# Patient Record
Sex: Female | Born: 1965
Health system: Southern US, Community
[De-identification: ages and names within clinical notes are randomized; demographics above are authoritative.]

## PROBLEM LIST (undated history)

## (undated) DIAGNOSIS — Z86010 Personal history of colonic polyps: Secondary | ICD-10-CM

## (undated) HISTORY — PX: BREAST SURGERY: SHX581

## (undated) HISTORY — PX: EYE SURGERY: SHX253

## (undated) HISTORY — DX: Personal history of colonic polyps: Z86.010

---

## 2013-10-02 ENCOUNTER — Ambulatory Visit (INDEPENDENT_AMBULATORY_CARE_PROVIDER_SITE_OTHER): Payer: BC Managed Care – PPO | Admitting: Emergency Medicine

## 2013-10-02 VITALS — BP 120/68 | HR 80 | Temp 98.3°F | Resp 14 | Ht 66.0 in | Wt 155.0 lb

## 2013-10-02 DIAGNOSIS — Z23 Encounter for immunization: Secondary | ICD-10-CM

## 2013-10-02 NOTE — Progress Notes (Signed)
[facility] [address], [place] [address] [phone]  Date:  10/02/2013   Name:  Shannon Contreras   DOB:  Feb 20, 1966   MRN:  [id number]  PCP:  No PCP Per Patient    Chief Complaint: PPD Reading   History of Present Illness:  Shannon Contreras is a 48 y.o. very pleasant female patient who presents with the following:  Comes for PPD read.  Needs varicella immunization.    There are no active problems to display for this patient.   History reviewed. No pertinent past medical history.  Past Surgical History  Procedure Laterality Date  . Eye surgery    . Breast surgery      History  Substance Use Topics  . Smoking status: Never Smoker   . Smokeless tobacco: Not on file  . Alcohol Use: Yes    Family History  Problem Relation Age of Onset  . Hypertension Mother   . Cancer Father     No Known Allergies  Medication list has been reviewed and updated.  No current outpatient prescriptions on file prior to visit.   No current facility-administered medications on file prior to visit.    Review of Systems:  As per HPI, otherwise negative.    Physical Examination: Filed Vitals:   10/02/13 1447  BP: 120/68  Pulse: 80  Temp: 98.3 F (36.8 C)  Resp: 14   Filed Vitals:   10/02/13 1447  Height: 5\' 6"  (1.676 m)  Weight: 155 lb (70.308 kg)   Body mass index is 25.03 kg/(m^2). Ideal Body Weight: Weight in (lb) to have BMI = 25: 154.6   GEN: WDWN, NAD, Non-toxic, Alert & Oriented x 3 HEENT: Atraumatic, Normocephalic.  Ears and Nose: No external deformity. EXTR: No clubbing/cyanosis/edema NEURO: Normal gait.  PSYCH: Normally interactive. Conversant. Not depressed or anxious appearing.  Calm demeanor.    Assessment and Plan: Sent for immunization PPD 12 mm   Signed,  Ellison Carwin, MD

## 2013-10-06 ENCOUNTER — Ambulatory Visit (INDEPENDENT_AMBULATORY_CARE_PROVIDER_SITE_OTHER): Payer: BC Managed Care – PPO | Admitting: Emergency Medicine

## 2013-10-06 ENCOUNTER — Ambulatory Visit: Payer: BC Managed Care – PPO

## 2013-10-06 VITALS — BP 110/64 | HR 78 | Temp 98.9°F | Resp 16 | Ht 65.5 in | Wt 151.0 lb

## 2013-10-06 DIAGNOSIS — R7611 Nonspecific reaction to tuberculin skin test without active tuberculosis: Secondary | ICD-10-CM

## 2013-10-06 MED ORDER — RIFAPENTINE 150 MG PO TABS: 6.0000 | ORAL_TABLET | ORAL | Status: DC

## 2013-10-06 MED ORDER — ISONIAZID 100 MG PO TABS: 100.0000 mg | ORAL_TABLET | ORAL | Status: DC

## 2013-10-06 NOTE — Patient Instructions (Signed)
Isoniazid, INH tablets What is this medicine? ISONIAZID (eye soe NYE a zid) is used to prevent or to treat tuberculosis (TB). This medicine may be used for other purposes; ask your health care provider or pharmacist if you have questions. What should I tell my health care provider before I take this medicine? They need to know if you have any of these conditions: -diabetes -HIV positive -if you frequently drink alcohol-containing beverages -kidney disease -liver disease -malnutrition -tingling of the fingers or toes, or other nerve disorder -an unusual or allergic reaction to isoniazid, other medicines, foods, dyes or preservatives -pregnant or trying to get pregnant -breast-feeding How should I use this medicine? Take this medicine by mouth with a glass of water. Follow the directions on the prescription label. Take this medicine on an empty stomach, at least 30 minutes before or 2 hours after food. Do not take with food. Take your medicine at regular intervals. Do not take your medicine more often than directed. For your therapy to work as well as possible, take each dose exactly as prescribed. Do not skip doses or stop your medicine even if you feel better. Skipping doses may make the TB resistant to this medicine and other medicines. Do not stop taking except on your doctor's advice. Talk to your pediatrician regarding the use of this medicine in children. Special care may be needed. Overdosage: If you think you have taken too much of this medicine contact a poison control center or emergency room at once. NOTE: This medicine is only for you. Do not share this medicine with others. What if I miss a dose? If you miss a dose, take it as soon as you can. If it is almost time for your next dose, take only that dose. Do not take double or extra doses. What may interact with this medicine? Do not take this medicine with any of the following medications: -entacapone -green  tea -levodopa -MAOIs like Carbex, Eldepryl, Marplan, Nardil, and Parnate -procarbazine -ranolazine -tolcapone This medicine may also interact with the following medications: -acetaminophen -alcohol -antacids -medicines for diabetes -medicines for fungal infections like ketoconazole and itraconazole -medicines for seizures like carbamazepine, phenobarbital, phenytoin, valproic acid -theophylline -zalcitabine This list may not describe all possible interactions. Give your health care provider a list of all the medicines, herbs, non-prescription drugs, or dietary supplements you use. Also tell them if you smoke, drink alcohol, or use illegal drugs. Some items may interact with your medicine. What should I watch for while using this medicine? Visit your doctor or health care professional for regular check ups. You will need blood work done regularly. You may need to take vitamin supplements while on this medicine. Talk to your doctor about the foods you eat and the vitamins you take. Avoid antacids for 2 hours before and after taking a dose of this medicine. Alcohol may interfere with the effect of this medicine. Avoid alcoholic drinks. If you are diabetic check your blood sugar as directed. Also, you may get a false-positive result for sugar in your urine. Talk with your doctor. What side effects may I notice from receiving this medicine? Side effects that you should report to your doctor or health care professional as soon as possible: -allergic reactions like skin rash, itching or hives, swelling of the face, lips, or tongue -breathing problems -changes in vision or eye pain -dark urine -fever, sore throat -hallucination, loss of contact with reality -loss of appetite -memory problems -nausea, vomiting -pain, tingling, numbness in the  hands or feet -redness, blistering, peeling or loosening of the skin, including inside the mouth -seizures -stomach pain -unusually weak or  tired -yellowing of the eyes or skin Side effects that usually do not require medical attention (report to your doctor or health care professional if they continue or are bothersome): -breast enlargement or tenderness -diarrhea -headache -upset stomach -trouble sleeping This list may not describe all possible side effects. Call your doctor for medical advice about side effects. You may report side effects to FDA at 1-800-FDA-1088. Where should I keep my medicine? Keep out of the reach of children. Store at room temperature between 15 and 30 degrees C (59 and 86 degrees F). Protect from light. Keep container tightly closed. Throw away any unused medicine after the expiration date. NOTE: This sheet is a summary. It may not cover all possible information. If you have questions about this medicine, talk to your doctor, pharmacist, or health care provider.  2014, Elsevier/Gold Standard. (2007-09-30 15:56:13)

## 2013-10-06 NOTE — Progress Notes (Addendum)
[facility] [address], [place] [address] [phone]  Date:  10/06/2013   Name:  Shannon Contreras   DOB:  09-19-1965   MRN:  [id number]  PCP:  No PCP Per Patient    Chief Complaint: CXR   History of Present Illness:  Shannon Contreras is a 48 y.o. very pleasant female patient who presents with the following:  For CXR following positive PPD   There are no active problems to display for this patient.   History reviewed. No pertinent past medical history.  Past Surgical History  Procedure Laterality Date  . Eye surgery    . Breast surgery      History  Substance Use Topics  . Smoking status: Never Smoker   . Smokeless tobacco: Not on file  . Alcohol Use: Yes    Family History  Problem Relation Age of Onset  . Hypertension Mother   . Cancer Father     No Known Allergies  Medication list has been reviewed and updated.  Current Outpatient Prescriptions on File Prior to Visit  Medication Sig Dispense Refill  . pregabalin (LYRICA) 25 MG capsule Take 25 mg by mouth 2 (two) times daily.       No current facility-administered medications on file prior to visit.    Review of Systems:  As per HPI, otherwise negative.    Physical Examination: Filed Vitals:   10/06/13 1244  BP: 110/64  Pulse: 78  Temp: 98.9 F (37.2 C)  Resp: 16   Filed Vitals:   10/06/13 1244  Height: 5' 5.5" (1.664 m)  Weight: 151 lb (68.493 kg)   Body mass index is 24.74 kg/(m^2). Ideal Body Weight: Weight in (lb) to have BMI = 25: 152.2   GEN: WDWN, NAD, Non-toxic, Alert & Oriented x 3 HEENT: Atraumatic, Normocephalic.  Ears and Nose: No external deformity. EXTR: No clubbing/cyanosis/edema NEURO: Normal gait.  PSYCH: Normally interactive. Conversant. Not depressed or anxious appearing.  Calm demeanor.    Assessment and Plan: CXR  Positive PPD Inh rifapentine   Signed,  Ellison Carwin, MD   [facility] reading (PRIMARY)  by  Dr. Ouida Sills  negative.   A

## 2016-03-22 DIAGNOSIS — Z0000 Encounter for general adult medical examination without abnormal findings: Secondary | ICD-10-CM | POA: Diagnosis not present

## 2016-04-06 ENCOUNTER — Ambulatory Visit (INDEPENDENT_AMBULATORY_CARE_PROVIDER_SITE_OTHER): Payer: BLUE CROSS/BLUE SHIELD | Admitting: Family Medicine

## 2016-04-06 VITALS — BP 120/72 | HR 85 | Temp 99.1°F | Resp 18 | Ht 65.5 in | Wt 159.0 lb

## 2016-04-06 DIAGNOSIS — Z1211 Encounter for screening for malignant neoplasm of colon: Secondary | ICD-10-CM

## 2016-04-06 DIAGNOSIS — Z01419 Encounter for gynecological examination (general) (routine) without abnormal findings: Secondary | ICD-10-CM | POA: Diagnosis not present

## 2016-04-06 DIAGNOSIS — Z113 Encounter for screening for infections with a predominantly sexual mode of transmission: Secondary | ICD-10-CM | POA: Diagnosis not present

## 2016-04-06 LAB — POCT URINALYSIS DIP (MANUAL ENTRY)
BILIRUBIN UA: NEGATIVE
GLUCOSE UA: NEGATIVE
Ketones, POC UA: NEGATIVE
Nitrite, UA: NEGATIVE
Protein Ur, POC: NEGATIVE
Spec Grav, UA: 1.01
Urobilinogen, UA: 0.2
pH, UA: 7

## 2016-04-06 LAB — POCT WET + KOH PREP
TRICH BY WET PREP: ABSENT
YEAST BY WET PREP: ABSENT
Yeast by KOH: ABSENT

## 2016-04-06 LAB — POC MICROSCOPIC URINALYSIS (UMFC): Mucus: ABSENT

## 2016-04-06 LAB — HIV ANTIBODY (ROUTINE TESTING W REFLEX): HIV 1&2 Ab, 4th Generation: NONREACTIVE

## 2016-04-06 LAB — POCT URINE PREGNANCY: Preg Test, Ur: NEGATIVE

## 2016-04-06 NOTE — Progress Notes (Signed)
Patient ID: Shannon Contreras, female    DOB: 10-Jan-1966, 50 y.o.   MRN: [id number]  PCP: No PCP Per Patient  Chief Complaint  Patient presents with  . pap smear  . colon screening    Subjective:   HPI 50 year old female presents for routine gynecological exam. She was seen here at [facility] several years ago. She works at [employer] and is here today requesting colon cancer screening and routine gynecological exam. She works with [employer] health at work program and receives her routine labs and exam through employee health and reports no current active health problems. She is divorced and is sexually active with female partners. Requests routine STD screening today.  Interested in [place] instead of colonoscopy and reports no family history of any cancer.   Social History   Social History  . Marital status: Married    Spouse name: N/A  . Number of children: N/A  . Years of education: N/A   Occupational History  . Not on file.   Social History Main Topics  . Smoking status: Never Smoker  . Smokeless tobacco: Never Used  . Alcohol use Yes  . Drug use: No  . Sexual activity: Not on file   Other Topics Concern  . Not on file   Social History Narrative  . No narrative on file    Family History  Problem Relation Age of Onset  . Hypertension Mother   . Cancer Father    Review of Systems See HPI  There are no active problems to display for this patient.    Prior to Admission medications   Medication Sig Start Date End Date Taking? Authorizing Provider  isoniazid (NYDRAZID) 100 MG tablet Take 1 tablet (100 mg total) by mouth once a week. Patient not taking: Reported on 04/06/2016 10/06/13   Roselee Culver, MD  pregabalin (LYRICA) 25 MG capsule Take 25 mg by mouth 2 (two) times daily.    Historical Provider, MD  Rifapentine 150 MG TABS Take 6 tablets (900 mg total) by mouth once a week. Patient not taking: Reported on 04/06/2016 10/06/13   Roselee Culver, MD    No Known Allergies     Objective:  Physical Exam  Constitutional: She is oriented to person, place, and time. She appears well-developed and well-nourished.  HENT:  Head: Normocephalic and atraumatic.  Eyes: Conjunctivae and EOM are normal. Pupils are equal, round, and reactive to light.  Neck: Normal range of motion. Neck supple.  Cardiovascular: Normal rate.   Pulmonary/Chest: Effort normal.  Abdominal: Soft.  Genitourinary: Vagina normal and uterus normal. No vaginal discharge found.  Genitourinary Comments: Normal female external genitalia without lesion. No inguinal lymphadenopathy. Vaginal mucosa is pink and moist without lesions. Cervix is closed without discharge, not friable. Pap smear obtained. No cervical motion tenderness, adnexal fullness or tenderness.  Musculoskeletal: Normal range of motion.  Neurological: She is alert and oriented to person, place, and time.  Skin: Skin is warm and dry.  Psychiatric: She has a normal mood and affect. Her behavior is normal. Judgment and thought content normal.   Vitals:   04/06/16 0910  BP: 120/72  Pulse: 85  Resp: 18  Temp: 99.1 F (37.3 C)   Assessment & Plan:  1. Encounter for annual routine gynecological examination - Pap IG and HPV (high risk) DNA detection - POCT Microscopic Urinalysis ([facility]) - POCT urine pregnancy - POCT Wet + KOH Prep - POCT urinalysis dipstick  2. Screen for STD (sexually  transmitted disease) - GC/Chlamydia Probe Amp - RPR - HIV antibody  3. Special screening for malignant neoplasms, colon - Cologuard  Will follow-up with you regarding your lab results.  Carroll Sage. Kenton Kingfisher, MSN, FNP-C [facility]

## 2016-04-06 NOTE — Patient Instructions (Addendum)
You will be notified of your lab results and your Cologuard specimen kit will be mailed.   IF you received an x-ray today, you will receive an invoice from [facility]. Please contact [facility] at [phone] with questions or concerns regarding your invoice.   IF you received labwork today, you will receive an invoice from [employer]. Please contact [facility] at [phone] with questions or concerns regarding your invoice.   Our billing staff will not be able to assist you with questions regarding bills from these companies.  You will be contacted with the lab results as soon as they are available. The fastest way to get your results is to activate your My Chart account. Instructions are located on the last page of this paperwork. If you have not heard from [place] regarding the results in 2 weeks, please contact this office.     Exercising to Stay Healthy Exercising regularly is important. It has many health benefits, such as:  Improving your overall fitness, flexibility, and endurance.  Increasing your bone density.  Helping with weight control.  Decreasing your body fat.  Increasing your muscle strength.  Reducing stress and tension.  Improving your overall health. In order to become healthy and stay healthy, it is recommended that you do moderate-intensity and vigorous-intensity exercise. You can tell that you are exercising at a moderate intensity if you have a higher heart rate and faster breathing, but you are still able to hold a conversation. You can tell that you are exercising at a vigorous intensity if you are breathing much harder and faster and cannot hold a conversation while exercising. HOW OFTEN SHOULD I EXERCISE? Choose an activity that you enjoy and set realistic goals. Your health care provider can help you to make an activity plan that works for you. Exercise regularly as directed by your health care provider. This may  include:   Doing resistance training twice each week, such as:  Push-ups.  Sit-ups.  Lifting weights.  Using resistance bands.  Doing a given intensity of exercise for a given amount of time. Choose from these options:  150 minutes of moderate-intensity exercise every week.  75 minutes of vigorous-intensity exercise every week.  A mix of moderate-intensity and vigorous-intensity exercise every week. Children, pregnant women, people who are out of shape, people who are overweight, and older adults may need to consult a health care provider for individual recommendations. If you have any sort of medical condition, be sure to consult your health care provider before starting a new exercise program.  WHAT ARE SOME EXERCISE IDEAS? Some moderate-intensity exercise ideas include:   Walking at a rate of 1 mile in 15 minutes.  Biking.  Hiking.  Golfing.  Dancing. Some vigorous-intensity exercise ideas include:   Walking at a rate of at least 4.5 miles per hour.  Jogging or running at a rate of 5 miles per hour.  Biking at a rate of at least 10 miles per hour.  Lap swimming.  Roller-skating or in-line skating.  Cross-country skiing.  Vigorous competitive sports, such as football, basketball, and soccer.  Jumping rope.  Aerobic dancing. WHAT ARE SOME EVERYDAY ACTIVITIES THAT CAN HELP ME TO GET EXERCISE?  Yard work, such as:  Psychologist, educational.  Raking and bagging leaves.  Washing and waxing your car.  Pushing a stroller.  Shoveling snow.  Gardening.  Washing windows or floors. HOW CAN I BE MORE ACTIVE IN MY DAY-TO-DAY ACTIVITIES?  Use the stairs instead of  the elevator.  Take a walk during your lunch break.  If you drive, park your car farther away from work or school.  If you take public transportation, get off one stop early and walk the rest of the way.  Make all of your phone calls while standing up and walking around.  Get up, stretch, and  walk around every 30 minutes throughout the day. WHAT GUIDELINES SHOULD I FOLLOW WHILE EXERCISING?  Do not exercise so much that you hurt yourself, feel dizzy, or get very short of breath.  Consult your health care provider before starting a new exercise program.  Wear comfortable clothes and shoes with good support.  Drink plenty of water while you exercise to prevent dehydration or heat stroke. Body water is lost during exercise and must be replaced.  Work out until you breathe faster and your heart beats faster.   This information is not intended to replace advice given to you by your health care provider. Make sure you discuss any questions you have with your health care provider.   Document Released: 06/16/2010 Document Revised: 06/04/2014 Document Reviewed: 10/15/2013 Elsevier Interactive Patient Education [employer].

## 2016-04-07 LAB — GC/CHLAMYDIA PROBE AMP
CT Probe RNA: NOT DETECTED
GC Probe RNA: NOT DETECTED

## 2016-04-07 LAB — RPR

## 2016-04-09 ENCOUNTER — Encounter: Payer: Self-pay | Admitting: Family Medicine

## 2016-04-09 NOTE — Progress Notes (Signed)
April 09, 2016   [address] [place] [address]   Dear Ms. Allen-Campbell,  Below are the results from your recent visit were as expected.  Resulted Orders  GC/Chlamydia Probe Amp  Result Value Ref Range   CT Probe RNA NOT DETECTED      Comment:                        **Normal Reference Range: NOT DETECTED**   This test was performed using the APTIMA COMBO2 Assay ([employer]   The analytical performance characteristics of this assay, when used to test SurePath specimens have been determined by [facility]      GC Probe RNA NOT DETECTED      Comment:                        **Normal Reference Range: NOT DETECTED**   This test was performed using the APTIMA COMBO2 Assay ([employer]   The analytical performance characteristics of this assay, when used to test SurePath specimens have been determined by [facility]      Narrative   Performed at:  [facility], [id number]                [place], [place] [address]  RPR  Result Value Ref Range   RPR Ser Ql NON REAC NON REAC   Narrative   Performed at:  [facility] [place], [id number]                [place], [place] [address]  HIV antibody  Result Value Ref Range   HIV 1&2 Ab, 4th Generation NONREACTIVE NONREACTIVE     Comment:       HIV-1 antigen and HIV-1/HIV-2 antibodies were not detected.  There is no laboratory evidence of HIV infection.   HIV-1/2 Antibody Diff        Not indicated. HIV-1 RNA, Qual TMA          Not indicated.     PLEASE NOTE: This information has been disclosed to you from records whose confidentiality may be protected by state law. If your state requires such protection, then the state law prohibits you from making any further disclosure of the information without the specific written consent of the person to whom it pertains, or as otherwise permitted by  law. A general authorization for the release of medical or other information is NOT sufficient for this purpose.   The performance of this assay has not been clinically validated in patients less than 20 years old.   For additional information please refer to http://education.[facility].com/faq/FAQ106.  (This link is being provided for informational/educational purposes only.)      Narrative   Performed at:  [facility], [id number]                [place], [place] [address]     If you have any questions or concerns, please don't hesitate to call.  Sincerely,   Carroll Sage. Kenton Kingfisher, MSN, FNP-C [facility]

## 2016-04-11 LAB — PAP IG AND HPV HIGH-RISK: HPV DNA HIGH RISK: NOT DETECTED

## 2016-08-17 ENCOUNTER — Ambulatory Visit (INDEPENDENT_AMBULATORY_CARE_PROVIDER_SITE_OTHER): Payer: BLUE CROSS/BLUE SHIELD | Admitting: Family Medicine

## 2016-08-17 ENCOUNTER — Encounter: Payer: Self-pay | Admitting: Family Medicine

## 2016-08-17 VITALS — BP 119/78 | HR 71 | Temp 98.0°F | Resp 16 | Ht 66.0 in | Wt 162.5 lb

## 2016-08-17 DIAGNOSIS — Z0000 Encounter for general adult medical examination without abnormal findings: Secondary | ICD-10-CM | POA: Diagnosis not present

## 2016-08-17 DIAGNOSIS — Z1231 Encounter for screening mammogram for malignant neoplasm of breast: Secondary | ICD-10-CM

## 2016-08-17 DIAGNOSIS — Z23 Encounter for immunization: Secondary | ICD-10-CM

## 2016-08-17 DIAGNOSIS — Z1211 Encounter for screening for malignant neoplasm of colon: Secondary | ICD-10-CM | POA: Diagnosis not present

## 2016-08-17 LAB — CBC WITH DIFFERENTIAL/PLATELET
BASOS ABS: 0 {cells}/uL (ref 0–200)
Basophils Relative: 0 %
EOS PCT: 3 %
Eosinophils Absolute: 219 cells/uL (ref 15–500)
HCT: 37.3 % (ref 35.0–45.0)
Hemoglobin: 12 g/dL (ref 11.7–15.5)
Lymphocytes Relative: 27 %
Lymphs Abs: 1971 cells/uL (ref 850–3900)
MCH: 28 pg (ref 27.0–33.0)
MCHC: 32.2 g/dL (ref 32.0–36.0)
MCV: 87.1 fL (ref 80.0–100.0)
MONOS PCT: 9 %
MPV: 10.5 fL (ref 7.5–12.5)
Monocytes Absolute: 657 cells/uL (ref 200–950)
NEUTROS ABS: 4453 {cells}/uL (ref 1500–7800)
NEUTROS PCT: 61 %
PLATELETS: 283 10*3/uL (ref 140–400)
RBC: 4.28 MIL/uL (ref 3.80–5.10)
RDW: 13.8 % (ref 11.0–15.0)
WBC: 7.3 10*3/uL (ref 3.8–10.8)

## 2016-08-17 LAB — HEPATIC FUNCTION PANEL
ALBUMIN: 4.1 g/dL (ref 3.6–5.1)
ALT: 14 U/L (ref 6–29)
AST: 19 U/L (ref 10–35)
Alkaline Phosphatase: 41 U/L (ref 33–130)
Bilirubin, Direct: 0.1 mg/dL (ref <=0.2)
Indirect Bilirubin: 0.3 mg/dL (ref 0.2–1.2)
Total Bilirubin: 0.4 mg/dL (ref 0.2–1.2)
Total Protein: 6.7 g/dL (ref 6.1–8.1)

## 2016-08-17 LAB — LIPID PANEL
CHOL/HDL RATIO: 2.4 ratio (ref <5.0)
CHOLESTEROL: 202 mg/dL — AB (ref <200)
HDL: 85 mg/dL (ref >50)
LDL Cholesterol: 104 mg/dL — ABNORMAL HIGH (ref <100)
TRIGLYCERIDES: 65 mg/dL (ref <150)
VLDL: 13 mg/dL (ref <30)

## 2016-08-17 LAB — BASIC METABOLIC PANEL
BUN: 10 mg/dL (ref 7–25)
CALCIUM: 9.1 mg/dL (ref 8.6–10.4)
CHLORIDE: 105 mmol/L (ref 98–110)
CO2: 27 mmol/L (ref 20–31)
CREATININE: 0.89 mg/dL (ref 0.50–1.05)
Glucose, Bld: 113 mg/dL — ABNORMAL HIGH (ref 65–99)
Potassium: 4 mmol/L (ref 3.5–5.3)
Sodium: 140 mmol/L (ref 135–146)

## 2016-08-17 LAB — TSH: TSH: 0.67 mIU/L

## 2016-08-17 NOTE — Progress Notes (Signed)
Pre visit review using our clinic review tool, if applicable. No additional management support is needed unless otherwise documented below in the visit note. 

## 2016-08-17 NOTE — Assessment & Plan Note (Signed)
Pt's PE WNL.  Tdap given today.  Referral to GI for colonoscopy placed and order entered for mammo.  Check labs.  Anticipatory guidance provided.

## 2016-08-17 NOTE — Addendum Note (Signed)
Addended by: Davis Gourd on: 08/17/2016 03:21 PM   Modules accepted: Orders

## 2016-08-17 NOTE — Patient Instructions (Signed)
Follow up in 1 year or as needed We'll notify you of your lab results and make any changes if needed Continue to work on healthy diet and regular exercise- you can do it! We'll call you with your GI appt for the colonoscopy consultation and the mammogram appt Call with any questions or concerns Welcome!  We're glad to have you!!!

## 2016-08-17 NOTE — Progress Notes (Signed)
   Subjective:    Patient ID: Shannon Contreras, female    DOB: [birth year], 51 y.o.   MRN: [id number]  HPI New to establish.  Previous MD- no regular provider since leaving [place] 3 yrs ago  CPE- no concerns today.  Due for colonoscopy, mammo.  UTD pap.  exercising   Review of Systems Patient reports no vision/ hearing changes, adenopathy,fever, weight change,  persistant/recurrent hoarseness , swallowing issues, chest pain, palpitations, edema, persistant/recurrent cough, hemoptysis, dyspnea (rest/exertional/paroxysmal nocturnal), gastrointestinal bleeding (melena, rectal bleeding), abdominal pain, significant heartburn, bowel changes, GU symptoms (dysuria, hematuria, incontinence), Gyn symptoms (abnormal  bleeding, pain),  syncope, focal weakness, memory loss, numbness & tingling, skin/hair/nail changes, abnormal bruising or bleeding, anxiety, or depression.     Objective:   Physical Exam General Appearance:    Alert, cooperative, no distress, appears stated age  Head:    Normocephalic, without obvious abnormality, atraumatic  Eyes:    PERRL, conjunctiva/corneas clear, EOM's intact, fundi    benign, both eyes  Ears:    Normal TM's and external ear canals, both ears  Nose:   Nares normal, septum midline, mucosa normal, no drainage    or sinus tenderness  Throat:   Lips, mucosa, and tongue normal; teeth and gums normal  Neck:   Supple, symmetrical, trachea midline, no adenopathy;    Thyroid: no enlargement/tenderness/nodules  Back:     Symmetric, no curvature, ROM normal, no CVA tenderness  Lungs:     Clear to auscultation bilaterally, respirations unlabored  Chest Wall:    No tenderness or deformity   Heart:    Regular rate and rhythm, S1 and S2 normal, no murmur, rub   or gallop  Breast Exam:    Deferred to GYN  Abdomen:     Soft, non-tender, bowel sounds active all four quadrants,    no masses, no organomegaly  Genitalia:    Deferred to GYN  Rectal:    Extremities:    Extremities normal, atraumatic, no cyanosis or edema  Pulses:   2+ and symmetric all extremities  Skin:   Skin color, texture, turgor normal, no rashes or lesions  Lymph nodes:   Cervical, supraclavicular, and axillary nodes normal  Neurologic:   CNII-XII intact, normal strength, sensation and reflexes    throughout          Assessment & Plan:

## 2016-08-18 LAB — VITAMIN D 25 HYDROXY (VIT D DEFICIENCY, FRACTURES): VIT D 25 HYDROXY: 28 ng/mL — AB (ref 30–100)

## 2016-08-20 ENCOUNTER — Encounter: Payer: Self-pay | Admitting: General Practice

## 2016-08-21 NOTE — Progress Notes (Signed)
Order(s) created erroneously. Erroneous order ID: [id number]  Order moved by: Darden Palmer  Order move date/time: 08/21/2016 3:14 PM  Source Patient: [id number]  Source Contact: 08/17/2016  Destination Patient: [id number]  Destination Contact: 08/12/2012

## 2016-08-22 ENCOUNTER — Encounter: Payer: Self-pay | Admitting: Internal Medicine

## 2016-09-18 ENCOUNTER — Ambulatory Visit
Admission: RE | Admit: 2016-09-18 | Discharge: 2016-09-18 | Disposition: A | Discharge status: Home / Self Care | Payer: BLUE CROSS/BLUE SHIELD | Source: Ambulatory Visit | Attending: Family Medicine | Admitting: Family Medicine

## 2016-09-18 DIAGNOSIS — Z1231 Encounter for screening mammogram for malignant neoplasm of breast: Secondary | ICD-10-CM | POA: Diagnosis not present

## 2016-10-05 ENCOUNTER — Encounter: Payer: Self-pay | Admitting: Internal Medicine

## 2016-10-05 ENCOUNTER — Ambulatory Visit (AMBULATORY_SURGERY_CENTER): Payer: Self-pay

## 2016-10-05 VITALS — Ht 67.0 in | Wt 162.2 lb

## 2016-10-05 DIAGNOSIS — Z1211 Encounter for screening for malignant neoplasm of colon: Secondary | ICD-10-CM

## 2016-10-05 NOTE — Progress Notes (Signed)
Denies allergies to eggs or soy products. Denies complication of anesthesia or sedation. Denies use of weight loss medication. Denies use of O2.   Emmi instructions given for colonoscopy.  

## 2016-10-12 ENCOUNTER — Encounter: Payer: Self-pay | Admitting: Internal Medicine

## 2016-10-12 ENCOUNTER — Ambulatory Visit (AMBULATORY_SURGERY_CENTER): Payer: BLUE CROSS/BLUE SHIELD | Admitting: Internal Medicine

## 2016-10-12 VITALS — BP 103/65 | HR 70 | Temp 98.7°F | Resp 13 | Ht 67.0 in | Wt 162.0 lb

## 2016-10-12 DIAGNOSIS — Z1211 Encounter for screening for malignant neoplasm of colon: Secondary | ICD-10-CM

## 2016-10-12 DIAGNOSIS — Z1212 Encounter for screening for malignant neoplasm of rectum: Secondary | ICD-10-CM | POA: Diagnosis not present

## 2016-10-12 DIAGNOSIS — D122 Benign neoplasm of ascending colon: Secondary | ICD-10-CM

## 2016-10-12 MED ORDER — SODIUM CHLORIDE 0.9 % IV SOLN: 500.0000 mL | INTRAVENOUS | Status: DC

## 2016-10-12 NOTE — Progress Notes (Signed)
Report to PACU, RN, vss, BBS= Clear.  

## 2016-10-12 NOTE — Patient Instructions (Addendum)
I found and removed one tiny polyp. It looks benign. I will let you know pathology results and when to have another routine colonoscopy by mail and/or My Chart.  I appreciate the opportunity to care for you. Gatha Mayer, MD, [facility]    Information on hemorrhoid banding given to you today  Information on polyps given to you today  Await pathology results in a letter from Dr Carlean Purl  Dr Celesta Aver office staff member will call you with banding of hemorrhoids appointments   YOU HAD AN ENDOSCOPIC PROCEDURE TODAY AT [facility]:   Refer to the procedure report that was given to you for any specific questions about what was found during the examination.  If the procedure report does not answer your questions, please call your gastroenterologist to clarify.  If you requested that your care partner not be given the details of your procedure findings, then the procedure report has been included in a sealed envelope for you to review at your convenience later.  YOU SHOULD EXPECT: Some feelings of bloating in the abdomen. Passage of more gas than usual.  Walking can help get rid of the air that was put into your GI tract during the procedure and reduce the bloating. If you had a lower endoscopy (such as a colonoscopy or flexible sigmoidoscopy) you may notice spotting of blood in your stool or on the toilet paper. If you underwent a bowel prep for your procedure, you may not have a normal bowel movement for a few days.  Please Note:  You might notice some irritation and congestion in your nose or some drainage.  This is from the oxygen used during your procedure.  There is no need for concern and it should clear up in a day or so.  SYMPTOMS TO REPORT IMMEDIATELY:   Following lower endoscopy (colonoscopy or flexible sigmoidoscopy):  Excessive amounts of blood in the stool  Significant tenderness or worsening of abdominal pains  Swelling of the abdomen that is new,  acute  Fever of 100F or higher    For urgent or emergent issues, a gastroenterologist can be reached at any hour by calling [phone].   DIET:  We do recommend a small meal at first, but then you may proceed to your regular diet.  Drink plenty of fluids but you should avoid alcoholic beverages for 24 hours.  ACTIVITY:  You should plan to take it easy for the rest of today and you should NOT DRIVE or use heavy machinery until tomorrow (because of the sedation medicines used during the test).    FOLLOW UP: Our staff will call the number listed on your records the next business day following your procedure to check on you and address any questions or concerns that you may have regarding the information given to you following your procedure. If we do not reach you, we will leave a message.  However, if you are feeling well and you are not experiencing any problems, there is no need to return our call.  We will assume that you have returned to your regular daily activities without incident.  If any biopsies were taken you will be contacted by phone or by letter within the next 1-3 weeks.  Please call us at [phone] if you have not heard about the biopsies in 3 weeks.    SIGNATURES/CONFIDENTIALITY: You and/or your care partner have signed paperwork which will be entered into your electronic medical record.  These signatures attest to  the fact that that the information above on your After Visit Summary has been reviewed and is understood.  Full responsibility of the confidentiality of this discharge information lies with you and/or your care-partner.

## 2016-10-12 NOTE — Progress Notes (Signed)
Pt's states no medical or surgical changes since previsit or office visit. 

## 2016-10-12 NOTE — Op Note (Addendum)
[place] Patient Name: Shannon Contreras Procedure Date: 10/12/2016 1:37 PM MRN: [id number] Endoscopist: Gatha Mayer , MD Age: 51 Referring MD:  Date of Birth: 10-Nov-1965 Gender: Female Account #: [id number] Procedure:                Colonoscopy Indications:              Screening for colorectal malignant neoplasm, This                            is the patient's first colonoscopy Medicines:                Propofol per Anesthesia, Monitored Anesthesia Care Procedure:                Pre-Anesthesia Assessment:                           - Prior to the procedure, a History and Physical                            was performed, and patient medications and                            allergies were reviewed. The patient's tolerance of                            previous anesthesia was also reviewed. The risks                            and benefits of the procedure and the sedation                            options and risks were discussed with the patient.                            All questions were answered, and informed consent                            was obtained. Prior Anticoagulants: The patient has                            taken no previous anticoagulant or antiplatelet                            agents. ASA Grade Assessment: I - A normal, healthy                            patient. After reviewing the risks and benefits,                            the patient was deemed in satisfactory condition to                            undergo the procedure.  After obtaining informed consent, the colonoscope                            was passed under direct vision. Throughout the                            procedure, the patient's blood pressure, pulse, and                            oxygen saturations were monitored continuously. The                            Colonoscope was introduced through the anus and   advanced to the the cecum, identified by                            appendiceal orifice and ileocecal valve. The                            colonoscopy was performed without difficulty. The                            patient tolerated the procedure well. The quality                            of the bowel preparation was excellent. The bowel                            preparation used was Miralax. The ileocecal valve,                            appendiceal orifice, and rectum were photographed. Scope In: 1:42:54 PM Scope Out: 1:55:45 PM Scope Withdrawal Time: 0 hours 9 minutes 2 seconds  Total Procedure Duration: 0 hours 12 minutes 51 seconds  Findings:                 The perianal and digital rectal examinations were                            normal.                           A 3 mm polyp was found in the ascending colon. The                            polyp was sessile. The polyp was removed with a                            cold snare. Resection and retrieval were complete.                            Verification of patient identification for the                            specimen  was done. Estimated blood loss was minimal.                           Internal hemorrhoids were found during retroflexion.                           The exam was otherwise without abnormality on                            direct and retroflexion views. Complications:            No immediate complications. Estimated Blood Loss:     Estimated blood loss was minimal. Impression:               - One 3 mm polyp in the ascending colon, removed                            with a cold snare. Resected and retrieved.                           - The examination was otherwise normal on direct                            and retroflexion views. Recommendation:           - Patient has a contact number available for                            emergencies. The signs and symptoms of potential                             delayed complications were discussed with the                            patient. Return to normal activities tomorrow.                            Written discharge instructions were provided to the                            patient.                           - Resume previous diet.                           - Continue present medications.                           - Repeat colonoscopy is recommended. The                            colonoscopy date will be determined after pathology                            results from today's exam become available for  review.                           - We will contact her from office for hemorrhoid                            banding appointment - she has sxs of bleeding and                            requests treatment Gatha Mayer, MD 10/12/2016 2:00:38 PM This report has been signed electronically.

## 2016-10-12 NOTE — Progress Notes (Signed)
Called to room to assist during endoscopic procedure.  Patient ID and intended procedure confirmed with present staff. Received instructions for my participation in the procedure from the performing physician.  

## 2016-10-15 ENCOUNTER — Telehealth: Payer: Self-pay | Admitting: *Deleted

## 2016-10-15 NOTE — Telephone Encounter (Signed)
  Follow up Call-  Call back number 10/12/2016  Post procedure Call Back phone  # [phone]  Permission to leave phone message Yes  Some recent data might be hidden     Patient questions:  Do you have a fever, pain , or abdominal swelling? No. Pain Score  0 *  Have you tolerated food without any problems? Yes.    Have you been able to return to your normal activities? Yes.    Do you have any questions about your discharge instructions: Diet   No. Medications  No. Follow up visit  No.  Do you have questions or concerns about your Care? No.  Actions: * If pain score is 4 or above: No action needed, pain <4.

## 2016-10-16 ENCOUNTER — Telehealth: Payer: Self-pay

## 2016-10-16 NOTE — Telephone Encounter (Signed)
Spoke with Thayer Headings and she said she's used up her time for this month and she will call us back next month to set up a future appointment for banding.

## 2016-10-16 NOTE — Telephone Encounter (Signed)
-----   Message from Gatha Mayer, MD sent at 10/12/2016  2:06 PM EDT ----- Regarding: Guess what? Needs non-urgent banding appt

## 2016-10-19 ENCOUNTER — Encounter: Payer: Self-pay | Admitting: Internal Medicine

## 2016-10-19 ENCOUNTER — Telehealth: Payer: Self-pay | Admitting: Family Medicine

## 2016-10-19 DIAGNOSIS — Z86010 Personal history of colonic polyps: Secondary | ICD-10-CM

## 2016-10-19 DIAGNOSIS — Z860101 Personal history of adenomatous and serrated colon polyps: Secondary | ICD-10-CM | POA: Insufficient documentation

## 2016-10-19 HISTORY — DX: Personal history of adenomatous and serrated colon polyps: Z86.0101

## 2016-10-19 HISTORY — DX: Personal history of colonic polyps: Z86.010

## 2016-10-19 NOTE — Telephone Encounter (Signed)
Chart updated to reflect change.

## 2016-10-19 NOTE — Telephone Encounter (Signed)
Pt calling asking if her chart could be updated, pt states that she gave wrong information regarding family history. Pt states that her grandfather on mothers side had prostate cancer.

## 2017-03-22 DIAGNOSIS — Z0000 Encounter for general adult medical examination without abnormal findings: Secondary | ICD-10-CM | POA: Diagnosis not present

## 2018-02-11 ENCOUNTER — Ambulatory Visit: Payer: BLUE CROSS/BLUE SHIELD | Admitting: Family Medicine

## 2018-02-12 ENCOUNTER — Encounter: Payer: Self-pay | Admitting: Family Medicine

## 2018-02-12 ENCOUNTER — Ambulatory Visit: Payer: BLUE CROSS/BLUE SHIELD | Admitting: Family Medicine

## 2018-02-12 ENCOUNTER — Other Ambulatory Visit: Payer: Self-pay

## 2018-02-12 ENCOUNTER — Encounter

## 2018-02-12 VITALS — BP 111/68 | HR 76 | Temp 98.1°F | Resp 16 | Ht 67.0 in | Wt 166.1 lb

## 2018-02-12 DIAGNOSIS — Z0000 Encounter for general adult medical examination without abnormal findings: Secondary | ICD-10-CM

## 2018-02-12 DIAGNOSIS — E663 Overweight: Secondary | ICD-10-CM | POA: Diagnosis not present

## 2018-02-12 DIAGNOSIS — E559 Vitamin D deficiency, unspecified: Secondary | ICD-10-CM

## 2018-02-12 NOTE — Progress Notes (Signed)
   Subjective:    Patient ID: Shannon Contreras, female    DOB: Oct 08, 1965, 52 y.o.   MRN: [id number]  HPI CPE- UTD on pap, colonoscopy, due for mammo.  Declines flu shot.  UTD on Tdap.   Review of Systems Patient reports no vision/ hearing changes, adenopathy,fever, weight change,  persistant/recurrent hoarseness , swallowing issues, chest pain, palpitations, edema, persistant/recurrent cough, hemoptysis, dyspnea (rest/exertional/paroxysmal nocturnal), gastrointestinal bleeding (melena, rectal bleeding), abdominal pain, significant heartburn, bowel changes, GU symptoms (dysuria, hematuria, incontinence), Gyn symptoms (abnormal  bleeding, pain),  syncope, focal weakness, memory loss, numbness & tingling, skin/hair/nail changes, abnormal bruising or bleeding, anxiety, or depression    Objective:   Physical Exam General Appearance:    Alert, cooperative, no distress, appears stated age  Head:    Normocephalic, without obvious abnormality, atraumatic  Eyes:    PERRL, conjunctiva/corneas clear, EOM's intact, fundi    benign, both eyes  Ears:    Normal TM's and external ear canals, both ears  Nose:   Nares normal, septum midline, mucosa normal, no drainage    or sinus tenderness  Throat:   Lips, mucosa, and tongue normal; teeth and gums normal  Neck:   Supple, symmetrical, trachea midline, no adenopathy;    Thyroid: no enlargement/tenderness/nodules  Back:     Symmetric, no curvature, ROM normal, no CVA tenderness  Lungs:     Clear to auscultation bilaterally, respirations unlabored  Chest Wall:    No tenderness or deformity   Heart:    Regular rate and rhythm, S1 and S2 normal, no murmur, rub   or gallop  Breast Exam:    Deferred to GYN  Abdomen:     Soft, non-tender, bowel sounds active all four quadrants,    no masses, no organomegaly  Genitalia:    Deferred to GYN  Rectal:    Extremities:   Extremities normal, atraumatic, no cyanosis or edema  Pulses:   2+ and symmetric  all extremities  Skin:   Skin color, texture, turgor normal, no rashes or lesions  Lymph nodes:   Cervical, supraclavicular, and axillary nodes normal  Neurologic:   CNII-XII intact, normal strength, sensation and reflexes    throughout          Assessment & Plan:

## 2018-02-12 NOTE — Assessment & Plan Note (Signed)
Pt's PE WNL w/ exception of being mildly overweight.  UTD on pap, colonoscopy.  Due for mammo- pt to schedule.  Check labs.  Anticipatory guidance provided.

## 2018-02-12 NOTE — Patient Instructions (Signed)
Follow up in 1 year or as needed We'll notify you of your lab results and make any changes if needed Keep up the good work on healthy diet and regular exercise- you look great!! Call the [facility] and schedule your mammogram [phone] Call with any questions or concerns Happy Early Birthday!!!

## 2018-02-12 NOTE — Assessment & Plan Note (Signed)
Pt has hx of Vit D deficiency.  Check labs and replete prn. 

## 2018-02-13 LAB — CBC WITH DIFFERENTIAL/PLATELET
BASOS ABS: 0 10*3/uL (ref 0.0–0.1)
Basophils Relative: 0.8 % (ref 0.0–3.0)
Eosinophils Absolute: 0.1 10*3/uL (ref 0.0–0.7)
Eosinophils Relative: 2.6 % (ref 0.0–5.0)
HCT: 37.5 % (ref 36.0–46.0)
Hemoglobin: 12.3 g/dL (ref 12.0–15.0)
LYMPHS ABS: 1.6 10*3/uL (ref 0.7–4.0)
Lymphocytes Relative: 29.6 % (ref 12.0–46.0)
MCHC: 32.7 g/dL (ref 30.0–36.0)
MCV: 83.5 fl (ref 78.0–100.0)
MONO ABS: 0.5 10*3/uL (ref 0.1–1.0)
MONOS PCT: 9.1 % (ref 3.0–12.0)
NEUTROS PCT: 57.9 % (ref 43.0–77.0)
Neutro Abs: 3.1 10*3/uL (ref 1.4–7.7)
PLATELETS: 258 10*3/uL (ref 150.0–400.0)
RBC: 4.49 Mil/uL (ref 3.87–5.11)
RDW: 13.7 % (ref 11.5–15.5)
WBC: 5.3 10*3/uL (ref 4.0–10.5)

## 2018-02-13 LAB — BASIC METABOLIC PANEL
BUN: 14 mg/dL (ref 6–23)
CHLORIDE: 105 meq/L (ref 96–112)
CO2: 31 meq/L (ref 19–32)
Calcium: 9.8 mg/dL (ref 8.4–10.5)
Creatinine, Ser: 1.09 mg/dL (ref 0.40–1.20)
GFR: 55.82 mL/min — AB (ref >60.00)
GLUCOSE: 117 mg/dL — AB (ref 70–99)
POTASSIUM: 4.6 meq/L (ref 3.5–5.1)
SODIUM: 143 meq/L (ref 135–145)

## 2018-02-13 LAB — HEPATIC FUNCTION PANEL
ALK PHOS: 45 U/L (ref 39–117)
ALT: 16 U/L (ref 0–35)
AST: 16 U/L (ref 0–37)
Albumin: 4.4 g/dL (ref 3.5–5.2)
Bilirubin, Direct: 0.1 mg/dL (ref 0.0–0.3)
TOTAL PROTEIN: 7.4 g/dL (ref 6.0–8.3)
Total Bilirubin: 0.4 mg/dL (ref 0.2–1.2)

## 2018-02-13 LAB — LIPID PANEL
CHOLESTEROL: 187 mg/dL (ref 0–200)
HDL: 70.3 mg/dL (ref >39.00)
LDL Cholesterol: 101 mg/dL — ABNORMAL HIGH (ref 0–99)
NonHDL: 116.56
TRIGLYCERIDES: 76 mg/dL (ref 0.0–149.0)
Total CHOL/HDL Ratio: 3
VLDL: 15.2 mg/dL (ref 0.0–40.0)

## 2018-02-13 LAB — TSH: TSH: 0.5 u[IU]/mL (ref 0.35–4.50)

## 2018-02-13 LAB — VITAMIN D 25 HYDROXY (VIT D DEFICIENCY, FRACTURES): VITD: 49.65 ng/mL (ref 30.00–100.00)

## 2018-02-14 ENCOUNTER — Other Ambulatory Visit (INDEPENDENT_AMBULATORY_CARE_PROVIDER_SITE_OTHER): Payer: BLUE CROSS/BLUE SHIELD

## 2018-02-14 DIAGNOSIS — R7309 Other abnormal glucose: Secondary | ICD-10-CM | POA: Diagnosis not present

## 2018-02-14 LAB — HEMOGLOBIN A1C: Hgb A1c MFr Bld: 6 % (ref 4.6–6.5)

## 2018-03-31 DIAGNOSIS — Z0000 Encounter for general adult medical examination without abnormal findings: Secondary | ICD-10-CM | POA: Diagnosis not present

## 2018-05-02 ENCOUNTER — Ambulatory Visit: Payer: BLUE CROSS/BLUE SHIELD | Admitting: Physician Assistant

## 2018-05-13 ENCOUNTER — Telehealth: Payer: Self-pay | Admitting: Emergency Medicine
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
## 2018-05-19 ENCOUNTER — Other Ambulatory Visit: Payer: Self-pay

## 2018-05-19 ENCOUNTER — Encounter: Payer: Self-pay | Admitting: Physician Assistant

## 2018-05-19 ENCOUNTER — Ambulatory Visit (INDEPENDENT_AMBULATORY_CARE_PROVIDER_SITE_OTHER): Payer: BLUE CROSS/BLUE SHIELD | Admitting: Physician Assistant

## 2018-05-19 DIAGNOSIS — M546 Pain in thoracic spine: Secondary | ICD-10-CM

## 2018-05-19 DIAGNOSIS — M542 Cervicalgia: Secondary | ICD-10-CM

## 2018-05-19 MED ORDER — CYCLOBENZAPRINE HCL 10 MG PO TABS: 10.0000 mg | ORAL_TABLET | Freq: Three times a day (TID) | ORAL | 0 refills | Status: DC | PRN

## 2018-05-19 MED ORDER — MELOXICAM 15 MG PO TABS: 15.0000 mg | ORAL_TABLET | Freq: Every day | ORAL | 0 refills | Status: DC
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
## 2018-05-19 NOTE — Patient Instructions (Signed)
Please go to the [facility] office in the morning for your x-ray. I will call with your results! Start the Meloxicam once daily as directed. The Flexeril can be used in the evening to alleviate muscle tension. You can take up to three times daily but do not drive while taking this medication.   We will alter regimen based on x-ray results.  Please go to the [facility] office for x-ray. We will call you with your results and alter treatment accordingly.   Shannon Contreras  [place], [place] [address]

## 2018-05-23 ENCOUNTER — Ambulatory Visit (INDEPENDENT_AMBULATORY_CARE_PROVIDER_SITE_OTHER): Payer: BLUE CROSS/BLUE SHIELD

## 2018-05-23 DIAGNOSIS — M542 Cervicalgia: Secondary | ICD-10-CM

## 2018-05-23 DIAGNOSIS — S3992XA Unspecified injury of lower back, initial encounter: Secondary | ICD-10-CM

## 2018-05-23 DIAGNOSIS — S199XXA Unspecified injury of neck, initial encounter: Secondary | ICD-10-CM | POA: Diagnosis not present

## 2018-05-23 DIAGNOSIS — S299XXA Unspecified injury of thorax, initial encounter: Secondary | ICD-10-CM | POA: Diagnosis not present

## 2018-05-23 NOTE — Addendum Note (Signed)
Addended by: Katina Dung on: 05/23/2018 12:54 PM   Modules accepted: Orders

## 2018-06-05 ENCOUNTER — Encounter: Payer: BLUE CROSS/BLUE SHIELD | Admitting: Family Medicine

## 2018-10-10 ENCOUNTER — Encounter

## 2018-10-10 ENCOUNTER — Encounter: Payer: BLUE CROSS/BLUE SHIELD | Admitting: Family Medicine

## 2019-05-07 ENCOUNTER — Other Ambulatory Visit: Payer: Self-pay | Admitting: Physician Assistant

## 2019-05-07 DIAGNOSIS — Z1231 Encounter for screening mammogram for malignant neoplasm of breast: Secondary | ICD-10-CM

## 2019-05-25 ENCOUNTER — Ambulatory Visit (INDEPENDENT_AMBULATORY_CARE_PROVIDER_SITE_OTHER): Payer: BC Managed Care – PPO | Admitting: Family Medicine

## 2019-05-25 ENCOUNTER — Other Ambulatory Visit: Payer: Self-pay

## 2019-05-25 ENCOUNTER — Encounter: Payer: Self-pay | Admitting: Family Medicine

## 2019-05-25 ENCOUNTER — Other Ambulatory Visit (HOSPITAL_COMMUNITY)
Admission: RE | Admit: 2019-05-25 | Discharge: 2019-05-25 | Disposition: A | Discharge status: Home / Self Care | Payer: BC Managed Care – PPO | Source: Ambulatory Visit | Attending: Family Medicine | Admitting: Family Medicine

## 2019-05-25 VITALS — BP 120/70 | HR 78 | Temp 97.9°F | Resp 16 | Ht 67.0 in | Wt 164.5 lb

## 2019-05-25 DIAGNOSIS — Z124 Encounter for screening for malignant neoplasm of cervix: Secondary | ICD-10-CM

## 2019-05-25 DIAGNOSIS — Z0000 Encounter for general adult medical examination without abnormal findings: Secondary | ICD-10-CM | POA: Diagnosis not present

## 2019-05-25 DIAGNOSIS — E559 Vitamin D deficiency, unspecified: Secondary | ICD-10-CM

## 2019-05-25 DIAGNOSIS — E663 Overweight: Secondary | ICD-10-CM | POA: Insufficient documentation

## 2019-05-25 LAB — CBC WITH DIFFERENTIAL/PLATELET
Basophils Absolute: 0 10*3/uL (ref 0.0–0.1)
Basophils Relative: 0.8 % (ref 0.0–3.0)
Eosinophils Absolute: 0.2 10*3/uL (ref 0.0–0.7)
Eosinophils Relative: 3 % (ref 0.0–5.0)
HCT: 38.5 % (ref 36.0–46.0)
Hemoglobin: 12.4 g/dL (ref 12.0–15.0)
Lymphocytes Relative: 26.4 % (ref 12.0–46.0)
Lymphs Abs: 1.4 10*3/uL (ref 0.7–4.0)
MCHC: 32.3 g/dL (ref 30.0–36.0)
MCV: 86.5 fl (ref 78.0–100.0)
Monocytes Absolute: 0.4 10*3/uL (ref 0.1–1.0)
Monocytes Relative: 7 % (ref 3.0–12.0)
Neutro Abs: 3.3 10*3/uL (ref 1.4–7.7)
Neutrophils Relative %: 62.8 % (ref 43.0–77.0)
Platelets: 243 10*3/uL (ref 150.0–400.0)
RBC: 4.45 Mil/uL (ref 3.87–5.11)
RDW: 13.3 % (ref 11.5–15.5)
WBC: 5.3 10*3/uL (ref 4.0–10.5)

## 2019-05-25 LAB — BASIC METABOLIC PANEL
BUN: 13 mg/dL (ref 6–23)
CO2: 29 mEq/L (ref 19–32)
Calcium: 9.4 mg/dL (ref 8.4–10.5)
Chloride: 104 mEq/L (ref 96–112)
Creatinine, Ser: 0.99 mg/dL (ref 0.40–1.20)
GFR: 58.4 mL/min — ABNORMAL LOW (ref >60.00)
Glucose, Bld: 73 mg/dL (ref 70–99)
Potassium: 3.4 mEq/L — ABNORMAL LOW (ref 3.5–5.1)
Sodium: 139 mEq/L (ref 135–145)

## 2019-05-25 LAB — HEPATIC FUNCTION PANEL
ALT: 20 U/L (ref 0–35)
AST: 23 U/L (ref 0–37)
Albumin: 4.4 g/dL (ref 3.5–5.2)
Alkaline Phosphatase: 53 U/L (ref 39–117)
Bilirubin, Direct: 0.1 mg/dL (ref 0.0–0.3)
Total Bilirubin: 0.3 mg/dL (ref 0.2–1.2)
Total Protein: 7.2 g/dL (ref 6.0–8.3)

## 2019-05-25 LAB — LIPID PANEL
Cholesterol: 233 mg/dL — ABNORMAL HIGH (ref 0–200)
HDL: 75 mg/dL (ref >39.00)
LDL Cholesterol: 137 mg/dL — ABNORMAL HIGH (ref 0–99)
NonHDL: 158.02
Total CHOL/HDL Ratio: 3
Triglycerides: 106 mg/dL (ref 0.0–149.0)
VLDL: 21.2 mg/dL (ref 0.0–40.0)

## 2019-05-25 LAB — VITAMIN D 25 HYDROXY (VIT D DEFICIENCY, FRACTURES): VITD: 55.41 ng/mL (ref 30.00–100.00)

## 2019-05-25 LAB — TSH: TSH: 0.9 u[IU]/mL (ref 0.35–4.50)

## 2019-05-25 NOTE — Assessment & Plan Note (Signed)
Pt has gained 5 lbs since last visit.  Stressed need for healthy diet and regular exercise.  Check labs to risk stratify.  Will follow.

## 2019-05-25 NOTE — Progress Notes (Signed)
   Subjective:    Patient ID: Shannon Contreras, female    DOB: 1966/01/25, 53 y.o.   MRN: [id number]  HPI CPE- UTD on colonoscopy, immunizations.  Mammo scheduled for tomorrow.  Due for pap.  Pt has gained 5 lbs since last visit.   Review of Systems Patient reports no vision/ hearing changes, adenopathy,fever, weight change,  persistant/recurrent hoarseness , swallowing issues, chest pain, palpitations, edema, persistant/recurrent cough, hemoptysis, dyspnea (rest/exertional/paroxysmal nocturnal), gastrointestinal bleeding (melena, rectal bleeding), abdominal pain, significant heartburn, bowel changes, GU symptoms (dysuria, hematuria, incontinence), Gyn symptoms (abnormal  bleeding, pain),  syncope, focal weakness, memory loss, numbness & tingling, skin/hair/nail changes, abnormal bruising or bleeding, anxiety, or depression.   This visit occurred during the SARS-CoV-2 public health emergency.  Safety protocols were in place, including screening questions prior to the visit, additional usage of staff PPE, and extensive cleaning of exam room while observing appropriate contact time as indicated for disinfecting solutions.      Objective:   Physical Exam  General Appearance:    Alert, cooperative, no distress, appears stated age  Head:    Normocephalic, without obvious abnormality, atraumatic  Eyes:    PERRL, conjunctiva/corneas clear, EOM's intact, fundi    benign, both eyes  Ears:    Normal TM's and external ear canals, both ears  Nose:   Deferred due to COVID  Throat:   Neck:   Supple, symmetrical, trachea midline, no adenopathy;    Thyroid: no enlargement/tenderness/nodules  Back:     Symmetric, no curvature, ROM normal, no CVA tenderness  Lungs:     Clear to auscultation bilaterally, respirations unlabored  Chest Wall:    No tenderness or deformity   Heart:    Regular rate and rhythm, S1 and S2 normal, no murmur, rub   or gallop  Breast Exam:    No tenderness, masses, or  nipple abnormality  Abdomen:     Soft, non-tender, bowel sounds active all four quadrants,    no masses, no organomegaly  Genitalia:    External genitalia normal, cervix normal in appearance, no CMT, uterus in normal size and position, adnexa w/out mass or tenderness, mucosa pink and moist, no lesions or discharge present  Rectal:    Large skin tag present  Extremities:   Extremities normal, atraumatic, no cyanosis or edema  Pulses:   2+ and symmetric all extremities  Skin:   Skin color, texture, turgor normal, no rashes or lesions  Lymph nodes:   Cervical, supraclavicular, and axillary nodes normal  Neurologic:   CNII-XII intact, normal strength, sensation and reflexes    throughout          Assessment & Plan:

## 2019-05-25 NOTE — Assessment & Plan Note (Signed)
Pt's PE WNL w/ exception of being overweight.  Mammo scheduled.  Pap done today.  Check labs.  Anticipatory guidance provided.

## 2019-05-25 NOTE — Patient Instructions (Signed)
Follow up in 1 year or as needed We'll notify you of your lab results and make any changes if needed Continue to work on healthy diet and regular exercise- you can do it!!! Call with any questions or concerns Stay Safe!  Stay Healthy! Happy New Year!!! 

## 2019-05-25 NOTE — Assessment & Plan Note (Signed)
Pt w/ hx of this.  Check labs and replete prn.

## 2019-05-26 ENCOUNTER — Ambulatory Visit
Admission: RE | Admit: 2019-05-26 | Discharge: 2019-05-26 | Disposition: A | Discharge status: Home / Self Care | Payer: BC Managed Care – PPO | Source: Ambulatory Visit | Attending: Physician Assistant | Admitting: Physician Assistant

## 2019-05-26 ENCOUNTER — Encounter: Payer: Self-pay | Admitting: General Practice

## 2019-05-26 ENCOUNTER — Other Ambulatory Visit: Payer: Self-pay

## 2019-05-26 DIAGNOSIS — Z1231 Encounter for screening mammogram for malignant neoplasm of breast: Secondary | ICD-10-CM

## 2019-05-26 LAB — CYTOLOGY - PAP: Diagnosis: NEGATIVE

## 2019-05-27 ENCOUNTER — Telehealth: Payer: Self-pay | Admitting: *Deleted

## 2019-05-27 ENCOUNTER — Other Ambulatory Visit: Payer: Self-pay | Admitting: Physician Assistant

## 2019-05-27 DIAGNOSIS — R928 Other abnormal and inconclusive findings on diagnostic imaging of breast: Secondary | ICD-10-CM

## 2019-05-27 NOTE — Telephone Encounter (Signed)
Noted  

## 2019-05-27 NOTE — Telephone Encounter (Signed)
Called and spoke with pt. She wanted to let us know that she had a mammogram yesterday and needs to have an [place] due to potential breast mass changes. She also wanted to know if she could have a repeat cholesterol lab by the end of January. I advised pt that with most insurance plans they would like at least 3 months between screening. Pt stated an understanding and said she would make sure to make diet and exercise changes and would call back if she would like to have these repeated.

## 2019-05-27 NOTE — Telephone Encounter (Signed)
Pt will like a call from PCP

## 2019-05-28 ENCOUNTER — Encounter: Payer: BLUE CROSS/BLUE SHIELD | Admitting: Family Medicine

## 2019-06-03 ENCOUNTER — Ambulatory Visit: Admission: RE | Admit: 2019-06-03 | Payer: BC Managed Care – PPO | Source: Ambulatory Visit

## 2019-06-03 ENCOUNTER — Other Ambulatory Visit: Payer: Self-pay

## 2019-06-03 ENCOUNTER — Ambulatory Visit
Admission: RE | Admit: 2019-06-03 | Discharge: 2019-06-03 | Disposition: A | Discharge status: Home / Self Care | Payer: BC Managed Care – PPO | Source: Ambulatory Visit | Attending: Physician Assistant | Admitting: Physician Assistant

## 2019-06-03 DIAGNOSIS — R922 Inconclusive mammogram: Secondary | ICD-10-CM | POA: Diagnosis not present

## 2019-06-03 DIAGNOSIS — R928 Other abnormal and inconclusive findings on diagnostic imaging of breast: Secondary | ICD-10-CM

## 2019-06-29 ENCOUNTER — Telehealth: Payer: Self-pay | Admitting: Family Medicine

## 2019-06-29 NOTE — Telephone Encounter (Signed)
Pt employer is altering her lunch schedule daily, which is causing problems for the pt. If she is forced to eat to early ( 11am ) she gets a stomach ache, if she eats too late ( 1pm ) it disrupts her sleep, causing her to be tired and non productive the next day. It is important to her to eat on a regular schedule.  She would like a letter from Dr. Birdie Riddle stating the following. Lunch should be consistently scheduled at no later than 12:45 based on the impact it has on her overall health.

## 2019-06-29 NOTE — Telephone Encounter (Signed)
We need a video visit to discuss this and so that I have documentation of our discussion to support a letter

## 2019-06-29 NOTE — Telephone Encounter (Signed)
Can we please get pt scheduled?

## 2019-06-29 NOTE — Telephone Encounter (Signed)
Please advise 

## 2019-06-30 NOTE — Telephone Encounter (Signed)
Stella informed pt 2/1 that a virtual visit needed to be scheduled to discuss letter. Pt checking her schedule and will call back

## 2019-07-01 ENCOUNTER — Other Ambulatory Visit: Payer: Self-pay

## 2019-07-01 ENCOUNTER — Ambulatory Visit (INDEPENDENT_AMBULATORY_CARE_PROVIDER_SITE_OTHER): Payer: BC Managed Care – PPO | Admitting: Family Medicine

## 2019-07-01 ENCOUNTER — Encounter: Payer: Self-pay | Admitting: Family Medicine

## 2019-07-01 DIAGNOSIS — R7303 Prediabetes: Secondary | ICD-10-CM | POA: Diagnosis not present

## 2019-07-01 DIAGNOSIS — Z563 Stressful work schedule: Secondary | ICD-10-CM | POA: Diagnosis not present

## 2019-07-01 NOTE — Progress Notes (Signed)
   Virtual Visit via Video   I connected with patient on 07/01/19 at  4:00 PM EST by a video enabled telemedicine application and verified that I am speaking with the correct person using two identifiers.  Location patient: Home Location provider: Acupuncturist, Office Persons participating in the virtual visit: Patient, Provider, [place] (Jess B)  I discussed the limitations of evaluation and management by telemedicine and the availability of in person appointments. The patient expressed understanding and agreed to proceed.  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  Pt reports that she must 'eat a certain time' b/c her body depends 'on consistency'.  Pt states that she eats a full breakfast daily.  Recently her lunch time has been moved earlier to 11:00.  She states at 11am she is not hungry.  She is not able to skip lunch b/c by the time she stops working at 6:00pm she is very hungry and has a headache.  HA causes her to have issues sleeping at night.  Pt has hx of pre-diabetes.  'the whole is just destroying me'.    ROS:   See pertinent positives and negatives per HPI.  Patient Active Problem List   Diagnosis Date Noted  . Overweight (BMI 25.0-29.9) 05/25/2019  . Vitamin D deficiency 02/12/2018  . Hx of adenomatous polyp of colon 10/19/2016  . Physical exam 08/17/2016    Social History   Tobacco Use  . Smoking status: Never Smoker  . Smokeless tobacco: Never Used  Substance Use Topics  . Alcohol use: Yes    Comment: socially     Current Outpatient Medications:  .  Ascorbic Acid (VITAMIN C) 100 MG tablet, Take 100 mg by mouth daily., Disp: , Rfl:  .  cholecalciferol (VITAMIN D) 1000 units tablet, Take 1,000 Units by mouth daily., Disp: , Rfl:  .  Multiple Vitamin (MULTIVITAMIN WITH MINERALS) TABS tablet, Take 1 tablet by mouth daily., Disp: , Rfl:   Allergies  Allergen Reactions  . Caffeine Other (See Comments)     Pt states that if she even smells caffeine (like in coffee) her eyes water and she gets a severe headache    Objective:   There were no vitals taken for this visit. AAOx3, NAD NCAT, EOMI No obvious CN deficits Coloring WNL Pt is able to speak clearly, coherently without shortness of breath or increased work of breathing.  Thought process is linear.  Mood is appropriate.   Assessment and Plan:   Stressful work schedule- due to pt's strict eating schedule and her history of pre-diabetes, she requires lunch to be at a certain time.  This avoids overeating, skipping meals, low blood sugar which subsequently causes headaches and poor sleep.  I provided pt with a letter to keep her lunch on a set schedule.  She is appreciative of this.   Annye Asa, MD 07/01/2019

## 2019-07-01 NOTE — Progress Notes (Signed)
I have discussed the procedure for the virtual visit with the patient who has given consent to proceed with assessment and treatment.   Pt unable to obtain vitals.   Shannon Contreras L Aila Terra, CMA     

## 2019-07-01 NOTE — Telephone Encounter (Signed)
VV scheduled for 07/01/2019.

## 2020-06-29 ENCOUNTER — Encounter: Payer: BC Managed Care – PPO | Admitting: Family Medicine

## 2020-08-08 ENCOUNTER — Encounter: Payer: BC Managed Care – PPO | Admitting: Family Medicine

## 2020-08-24 ENCOUNTER — Encounter: Payer: BC Managed Care – PPO | Admitting: Family Medicine

## 2020-09-07 ENCOUNTER — Ambulatory Visit (INDEPENDENT_AMBULATORY_CARE_PROVIDER_SITE_OTHER): Payer: BC Managed Care – PPO | Admitting: Family Medicine

## 2020-09-07 ENCOUNTER — Encounter: Payer: Self-pay | Admitting: Family Medicine

## 2020-09-07 ENCOUNTER — Other Ambulatory Visit: Payer: Self-pay

## 2020-09-07 VITALS — BP 100/60 | HR 116 | Temp 97.7°F | Resp 20 | Ht 66.0 in | Wt 160.2 lb

## 2020-09-07 DIAGNOSIS — Z0000 Encounter for general adult medical examination without abnormal findings: Secondary | ICD-10-CM

## 2020-09-07 DIAGNOSIS — E559 Vitamin D deficiency, unspecified: Secondary | ICD-10-CM | POA: Diagnosis not present

## 2020-09-07 DIAGNOSIS — E663 Overweight: Secondary | ICD-10-CM

## 2020-09-07 LAB — CBC WITH DIFFERENTIAL/PLATELET
Basophils Absolute: 0 10*3/uL (ref 0.0–0.1)
Basophils Relative: 0.8 % (ref 0.0–3.0)
Eosinophils Absolute: 0.2 10*3/uL (ref 0.0–0.7)
Eosinophils Relative: 4.1 % (ref 0.0–5.0)
HCT: 37 % (ref 36.0–46.0)
Hemoglobin: 12 g/dL (ref 12.0–15.0)
Lymphocytes Relative: 29.2 % (ref 12.0–46.0)
Lymphs Abs: 1.3 10*3/uL (ref 0.7–4.0)
MCHC: 32.5 g/dL (ref 30.0–36.0)
MCV: 85 fl (ref 78.0–100.0)
Monocytes Absolute: 0.4 10*3/uL (ref 0.1–1.0)
Monocytes Relative: 9.2 % (ref 3.0–12.0)
Neutro Abs: 2.6 10*3/uL (ref 1.4–7.7)
Neutrophils Relative %: 56.7 % (ref 43.0–77.0)
Platelets: 236 10*3/uL (ref 150.0–400.0)
RBC: 4.36 Mil/uL (ref 3.87–5.11)
RDW: 13.3 % (ref 11.5–15.5)
WBC: 4.5 10*3/uL (ref 4.0–10.5)

## 2020-09-07 LAB — LIPID PANEL
Cholesterol: 196 mg/dL (ref 0–200)
HDL: 79.4 mg/dL (ref >39.00)
LDL Cholesterol: 104 mg/dL — ABNORMAL HIGH (ref 0–99)
NonHDL: 116.49
Total CHOL/HDL Ratio: 2
Triglycerides: 64 mg/dL (ref 0.0–149.0)
VLDL: 12.8 mg/dL (ref 0.0–40.0)

## 2020-09-07 LAB — BASIC METABOLIC PANEL
BUN: 13 mg/dL (ref 6–23)
CO2: 28 mEq/L (ref 19–32)
Calcium: 9.5 mg/dL (ref 8.4–10.5)
Chloride: 105 mEq/L (ref 96–112)
Creatinine, Ser: 1.04 mg/dL (ref 0.40–1.20)
GFR: 60.5 mL/min (ref >60.00)
Glucose, Bld: 74 mg/dL (ref 70–99)
Potassium: 3.7 mEq/L (ref 3.5–5.1)
Sodium: 141 mEq/L (ref 135–145)

## 2020-09-07 LAB — HEPATIC FUNCTION PANEL
ALT: 22 U/L (ref 0–35)
AST: 21 U/L (ref 0–37)
Albumin: 4 g/dL (ref 3.5–5.2)
Alkaline Phosphatase: 55 U/L (ref 39–117)
Bilirubin, Direct: 0 mg/dL (ref 0.0–0.3)
Total Bilirubin: 0.4 mg/dL (ref 0.2–1.2)
Total Protein: 7.3 g/dL (ref 6.0–8.3)

## 2020-09-07 LAB — VITAMIN D 25 HYDROXY (VIT D DEFICIENCY, FRACTURES): VITD: 64.73 ng/mL (ref 30.00–100.00)

## 2020-09-07 LAB — TSH: TSH: 0.73 u[IU]/mL (ref 0.35–4.50)

## 2020-09-07 NOTE — Assessment & Plan Note (Signed)
Pt's PE WNL.  UTD on pap, mammo, colonoscopy, immunizations.  Check labs.  Anticipatory guidance provided.  

## 2020-09-07 NOTE — Assessment & Plan Note (Signed)
Pt is down 5 lbs since last visit.  Applauded her efforts.  Check labs to risk stratify.  Will follow.

## 2020-09-07 NOTE — Patient Instructions (Signed)
Follow up in 1 year or as needed We'll notify you of your lab results and make any changes if needed Continue to work on healthy diet and regular exercise- you're doing great! Call with any questions or concerns Stay Safe!  Stay Healthy! Happy Spring!!!

## 2020-09-07 NOTE — Progress Notes (Signed)
   Subjective:    Patient ID: Shannon Contreras, female    DOB: [birth year], 55 y.o.   MRN: [id number]  HPI CPE- UTD on Tdap, colonoscopy, mammo, pap.  Had 1 COVID vaccine  Reviewed past medical, surgical, family and social histories.   Health Maintenance  Topic Date Due  . Hepatitis C Screening  Never done  . INFLUENZA VACCINE  12/26/2020  . MAMMOGRAM  05/25/2021  . PAP SMEAR-Modifier  05/24/2022  . COLONOSCOPY (Pts 45-46yrs Insurance coverage will need to be confirmed)  10/13/2023  . TETANUS/TDAP  08/18/2026  . HIV Screening  Completed  . HPV VACCINES  Aged Out      Review of Systems Patient reports no vision/ hearing changes, adenopathy,fever, weight change,  persistant/recurrent hoarseness , swallowing issues, chest pain, palpitations, edema, persistant/recurrent cough, hemoptysis, dyspnea (rest/exertional/paroxysmal nocturnal), gastrointestinal bleeding (melena, rectal bleeding), abdominal pain, significant heartburn, bowel changes, GU symptoms (dysuria, hematuria, incontinence), Gyn symptoms (abnormal  bleeding, pain),  syncope, focal weakness, memory loss, numbness & tingling, skin/hair/nail changes, abnormal bruising or bleeding, anxiety, or depression.   This visit occurred during the SARS-CoV-2 public health emergency.  Safety protocols were in place, including screening questions prior to the visit, additional usage of staff PPE, and extensive cleaning of exam room while observing appropriate contact time as indicated for disinfecting solutions.       Objective:   Physical Exam General Appearance:    Alert, cooperative, no distress, appears stated age  Head:    Normocephalic, without obvious abnormality, atraumatic  Eyes:    PERRL, conjunctiva/corneas clear, EOM's intact, fundi    benign, both eyes  Ears:    Normal TM's and external ear canals, both ears  Nose:   Deferred due to COVID  Throat:   Neck:   Supple, symmetrical, trachea midline, no adenopathy;     Thyroid: no enlargement/tenderness/nodules  Back:     Symmetric, no curvature, ROM normal, no CVA tenderness  Lungs:     Clear to auscultation bilaterally, respirations unlabored  Chest Wall:    No tenderness or deformity   Heart:    Regular rate and rhythm, S1 and S2 normal, no murmur, rub   or gallop  Breast Exam:    Deferred to mammo  Abdomen:     Soft, non-tender, bowel sounds active all four quadrants,    no masses, no organomegaly  Genitalia:    Deferred  Rectal:    Extremities:   Extremities normal, atraumatic, no cyanosis or edema  Pulses:   2+ and symmetric all extremities  Skin:   Skin color, texture, turgor normal, no rashes or lesions  Lymph nodes:   Cervical, supraclavicular, and axillary nodes normal  Neurologic:   CNII-XII intact, normal strength, sensation and reflexes    throughout          Assessment & Plan:

## 2020-09-07 NOTE — Assessment & Plan Note (Signed)
Pt has hx of this.  Check labs and replete prn. 

## 2021-02-01 DIAGNOSIS — Z1231 Encounter for screening mammogram for malignant neoplasm of breast: Secondary | ICD-10-CM | POA: Diagnosis not present

## 2021-02-01 LAB — HM MAMMOGRAPHY

## 2021-02-14 ENCOUNTER — Encounter: Payer: Self-pay | Admitting: Family Medicine

## 2021-06-01 ENCOUNTER — Telehealth: Payer: BC Managed Care – PPO | Admitting: Nurse Practitioner

## 2021-06-01 ENCOUNTER — Telehealth: Payer: Self-pay

## 2021-06-01 DIAGNOSIS — J40 Bronchitis, not specified as acute or chronic: Secondary | ICD-10-CM

## 2021-06-01 MED ORDER — AZITHROMYCIN 250 MG PO TABS: ORAL_TABLET | ORAL | 0 refills | Status: DC

## 2021-06-01 MED ORDER — BENZONATATE 100 MG PO CAPS: 100.0000 mg | ORAL_CAPSULE | Freq: Three times a day (TID) | ORAL | 0 refills | Status: DC | PRN

## 2021-06-01 NOTE — Telephone Encounter (Signed)
Error

## 2021-06-01 NOTE — Progress Notes (Signed)
We are sorry that you are not feeling well.  Here is how we plan to help!   Based on your presentation I believe you most likely have A cough due to bacteria.  When patients have a fever and a productive cough with a change in color or increased sputum production, we are concerned about bacterial bronchitis.  If left untreated it can progress to pneumonia.  If your symptoms do not improve with your treatment plan it is important that you contact your provider.   I have prescribed Azithromyin 250 mg: two tablets now and then one tablet daily for 4 additonal days    In addition you may use A prescription cough medication called Tessalon Perles 100mg . You may take 1-2 capsules every 8 hours as needed for your cough.     From your responses in the eVisit questionnaire you describe inflammation in the upper respiratory tract which is causing a significant cough.  This is commonly called Bronchitis and has four common causes:   Allergies Viral Infections Acid Reflux Bacterial Infection Allergies, viruses and acid reflux are treated by controlling symptoms or eliminating the cause. An example might be a cough caused by taking certain blood pressure medications. You stop the cough by changing the medication. Another example might be a cough caused by acid reflux. Controlling the reflux helps control the cough.               HOME CARE Only take medications as instructed by your medical team. Complete the entire course of an antibiotic. Drink plenty of fluids and get plenty of rest. Avoid close contacts especially the very young and the elderly Cover your mouth if you cough or cough into your sleeve. Always remember to wash your hands A steam or ultrasonic humidifier can help congestion.    GET HELP RIGHT AWAY IF: You develop worsening fever. You become short of breath You cough up blood. Your symptoms persist after you have completed your treatment plan MAKE SURE YOU  Understand these  instructions. Will watch your condition. Will get help right away if you are not doing well or get worse.     Thank you for choosing an e-visit.   Your e-visit answers were reviewed by a board certified advanced clinical practitioner to complete your personal care plan. Depending upon the condition, your plan could have included both over the counter or prescription medications.   Please review your pharmacy choice. Make sure the pharmacy is open so you can pick up prescription now. If there is a problem, you may contact your provider through [employer] and have the prescription routed to another pharmacy.  Your safety is important to [place]. If you have drug allergies check your prescription carefully.    For the next 24 hours you can use MyChart to ask questions about today's visit, request a non-urgent call back, or ask for a work or school excuse. You will get an email in the next two days asking about your experience. I hope that your e-visit has been valuable and will speed your recovery.   I spent approximately 5 minutes reviewing the patient's history, current symptoms and coordinating their plan of care today.    Meds ordered this encounter  Medications   azithromycin (ZITHROMAX) 250 MG tablet    Sig: Take 2 tablets on day 1, then 1 tablet daily on days 2 through 5    Dispense:  6 tablet    Refill:  0   benzonatate (TESSALON) 100  MG capsule    Sig: Take 1 capsule (100 mg total) by mouth 3 (three) times daily as needed for up to 10 days for cough.    Dispense:  30 capsule    Refill:  0

## 2021-06-02 ENCOUNTER — Telehealth: Payer: Self-pay

## 2021-06-02 ENCOUNTER — Telehealth (INDEPENDENT_AMBULATORY_CARE_PROVIDER_SITE_OTHER): Payer: BC Managed Care – PPO | Admitting: Registered Nurse

## 2021-06-02 ENCOUNTER — Other Ambulatory Visit: Payer: Self-pay | Admitting: Registered Nurse

## 2021-06-02 ENCOUNTER — Other Ambulatory Visit: Payer: Self-pay

## 2021-06-02 ENCOUNTER — Encounter: Payer: Self-pay | Admitting: Registered Nurse

## 2021-06-02 DIAGNOSIS — J40 Bronchitis, not specified as acute or chronic: Secondary | ICD-10-CM

## 2021-06-02 MED ORDER — BENZONATATE 100 MG PO CAPS: 100.0000 mg | ORAL_CAPSULE | Freq: Three times a day (TID) | ORAL | 0 refills | Status: DC | PRN

## 2021-06-02 MED ORDER — PREDNISONE 10 MG (21) PO TBPK: ORAL_TABLET | ORAL | 0 refills | Status: DC

## 2021-06-02 MED ORDER — AZITHROMYCIN 250 MG PO TABS: ORAL_TABLET | ORAL | 0 refills | Status: AC

## 2021-06-02 NOTE — Telephone Encounter (Signed)
Caller name:Airanna Allen-Campbell   On DPR? :Yes  Call back [phone]  Provider they see: Maximiano Coss   Reason for call:[employer] is calling pt is sick and unable to pick up medication and wants [facility] Phone number [phone] Fax number [phone]  to handle so they can deliver [facility] can not deliver  azithromycin (ZITHROMAX) 250 MG tablet ,  benzonatate (TESSALON) 100 MG capsule  predniSONE (STERAPRED UNI-PAK 21 TAB) 10 MG (21) TBPK tablet [[id number]]

## 2021-06-02 NOTE — Progress Notes (Signed)
Telemedicine Encounter- SOAP NOTE Established Patient  This telephone encounter was conducted with the patient's (or proxy's) verbal consent via audio telecommunications: yes/no: Yes Patient was instructed to have this encounter in a suitably private space; and to only have persons present to whom they give permission to participate. In addition, patient identity was confirmed by use of name plus two identifiers (DOB and address).  I discussed the limitations, risks, security and privacy concerns of performing an evaluation and management service by telephone and the availability of in person appointments. I also discussed with the patient that there may be a patient responsible charge related to this service. The patient expressed understanding and agreed to proceed.  I spent a total of 15 minutes talking with the patient or their proxy.  Patient at home Provider in office  Participants: Kathrin Ruddy, NP and Wess Botts Contreras  Chief Complaint  Patient presents with   Cough    Patient states for about one week patient has been having a cough , fever, congestion on and off. Patient has been only taking vitamins. Pt states she went out when it was really cold.    Subjective   Shannon Contreras is a 56 y.o. established patient. Telephone visit today for cough  HPI Onset one week ago - notes she had spent some time outside in cold weather.  Had evisit with NP yesterday  Started on zpack and tessalon yesterday - has not picked up yet as she was unsure of E-Visit Continues to take multivitamin   Has cough with yellow mucus.  Some feverish feeling. At times congestion. Notes some fatigue.  No shob, nvd, headaches, chest pain, palpitations.   Patient Active Problem List   Diagnosis Date Noted   Overweight (BMI 25.0-29.9) 05/25/2019   Vitamin D deficiency 02/12/2018   Hx of adenomatous polyp of colon 10/19/2016   Physical exam 08/17/2016    Past Medical  History:  Diagnosis Date   Hx of adenomatous polyp of colon 10/19/2016    Current Outpatient Medications  Medication Sig Dispense Refill   Multiple Vitamin (MULTIVITAMIN WITH MINERALS) TABS tablet Take 1 tablet by mouth daily.     predniSONE (STERAPRED UNI-PAK 21 TAB) 10 MG (21) TBPK tablet Take per package instructions. Do not skip doses. Finish entire supply. 1 each 0   Ascorbic Acid (VITAMIN C) 100 MG tablet Take 100 mg by mouth daily. (Patient not taking: Reported on 06/02/2021)     azithromycin (ZITHROMAX) 250 MG tablet Take 2 tablets on day 1, then 1 tablet daily on days 2 through 5 (Patient not taking: Reported on 06/02/2021) 6 tablet 0   benzonatate (TESSALON) 100 MG capsule Take 1 capsule (100 mg total) by mouth 3 (three) times daily as needed for up to 10 days for cough. (Patient not taking: Reported on 06/02/2021) 30 capsule 0   cholecalciferol (VITAMIN D) 1000 units tablet Take 1,000 Units by mouth daily. (Patient not taking: Reported on 06/02/2021)     No current facility-administered medications for this visit.    Allergies  Allergen Reactions   Caffeine Other (See Comments)    Pt states that if she even smells caffeine (like in coffee) her eyes water and she gets a severe headache    Social History   Socioeconomic History   Marital status: Divorced    Spouse name: Not on file   Number of children: Not on file   Years of education: Not on file   Highest education level: Not  on file  Occupational History   Not on file  Tobacco Use   Smoking status: Never   Smokeless tobacco: Never  Substance and Sexual Activity   Alcohol use: Yes    Comment: socially    Drug use: No   Sexual activity: Not on file  Other Topics Concern   Not on file  Social History Narrative   Not on file   Social Determinants of Health   Financial Resource Strain: Not on file  Food Insecurity: Not on file  Transportation Needs: Not on file  Physical Activity: Not on file  Stress: Not on file   Social Connections: Not on file  Intimate Partner Violence: Not on file    Review of Systems  Constitutional: Negative.   HENT: Negative.    Eyes: Negative.   Respiratory:  Positive for cough and sputum production. Negative for hemoptysis, shortness of breath and wheezing.   Cardiovascular: Negative.   Gastrointestinal: Negative.   Genitourinary: Negative.   Musculoskeletal: Negative.   Skin: Negative.   Neurological: Negative.   Endo/Heme/Allergies: Negative.   Psychiatric/Behavioral: Negative.    All other systems reviewed and are negative.  Objective   Vitals as reported by the patient: There were no vitals filed for this visit.  [name] was seen today for cough.  Diagnoses and all orders for this visit:  Bronchitis -     predniSONE (STERAPRED UNI-PAK 21 TAB) 10 MG (21) TBPK tablet; Take per package instructions. Do not skip doses. Finish entire supply.    PLAN Endorsed plan to have z pack and tessalon as treatment Add prednisone taper Discussed safety of this course, though acknowledged risks, benefits and AE. Patient encouraged to call clinic with any questions, comments, or concerns.  I discussed the assessment and treatment plan with the patient. The patient was provided an opportunity to ask questions and all were answered. The patient agreed with the plan and demonstrated an understanding of the instructions.   The patient was advised to call back or seek an in-person evaluation if the symptoms worsen or if the condition fails to improve as anticipated.  I provided 15 minutes of non-face-to-face time during this encounter.  Maximiano Coss, NP

## 2021-06-02 NOTE — Telephone Encounter (Signed)
Sent Thanks Rich

## 2021-06-02 NOTE — Patient Instructions (Signed)
° ° ° °  If you have lab work done today you will be contacted with your lab results within the next 2 weeks.  If you have not heard from us then please contact us. The fastest way to get your results is to register for My Chart. ° ° °IF you received an x-ray today, you will receive an invoice from [facility]. Please contact [facility] at [phone] with questions or concerns regarding your invoice.  ° °IF you received labwork today, you will receive an invoice from [facility]. Please contact [facility] at [phone] with questions or concerns regarding your invoice.  ° °Our billing staff will not be able to assist you with questions regarding bills from these companies. ° °You will be contacted with the lab results as soon as they are available. The fastest way to get your results is to activate your My Chart account. Instructions are located on the last page of this paperwork. If you have not heard from us regarding the results in 2 weeks, please contact this office. °  ° ° ° °

## 2021-06-08 ENCOUNTER — Telehealth: Payer: Self-pay | Admitting: Family Medicine

## 2021-06-08 NOTE — Telephone Encounter (Signed)
Pt called in stating that she isn't doing any better. She still has the very bad cough and isn't about to sleep at night. Please advise she saw Delfino Lovett last

## 2021-06-09 ENCOUNTER — Ambulatory Visit: Payer: BC Managed Care – PPO | Admitting: Registered Nurse

## 2021-06-09 ENCOUNTER — Encounter: Payer: Self-pay | Admitting: Registered Nurse

## 2021-06-09 ENCOUNTER — Ambulatory Visit
Admission: RE | Admit: 2021-06-09 | Discharge: 2021-06-09 | Disposition: A | Discharge status: Home / Self Care | Payer: BC Managed Care – PPO | Source: Ambulatory Visit | Attending: Registered Nurse | Admitting: Registered Nurse

## 2021-06-09 ENCOUNTER — Ambulatory Visit (INDEPENDENT_AMBULATORY_CARE_PROVIDER_SITE_OTHER): Payer: BC Managed Care – PPO | Admitting: Registered Nurse

## 2021-06-09 ENCOUNTER — Other Ambulatory Visit: Payer: Self-pay

## 2021-06-09 VITALS — BP 124/66 | HR 75 | Temp 99.1°F | Resp 24 | Ht 67.0 in | Wt 155.2 lb

## 2021-06-09 DIAGNOSIS — R059 Cough, unspecified: Secondary | ICD-10-CM | POA: Diagnosis not present

## 2021-06-09 DIAGNOSIS — J40 Bronchitis, not specified as acute or chronic: Secondary | ICD-10-CM | POA: Diagnosis not present

## 2021-06-09 DIAGNOSIS — R051 Acute cough: Secondary | ICD-10-CM

## 2021-06-09 MED ORDER — HYDROCODONE BIT-HOMATROP MBR 5-1.5 MG/5ML PO SOLN
5.0000 mL | Freq: Three times a day (TID) | ORAL | 0 refills | Status: DC | PRN
Start: 2021-06-09 — End: 2021-09-27

## 2021-06-09 MED ORDER — ALBUTEROL SULFATE HFA 108 (90 BASE) MCG/ACT IN AERS: 2.0000 | INHALATION_SPRAY | Freq: Four times a day (QID) | RESPIRATORY_TRACT | 0 refills | Status: DC | PRN

## 2021-06-09 MED ORDER — PANTOPRAZOLE SODIUM 40 MG PO TBEC: 40.0000 mg | DELAYED_RELEASE_TABLET | Freq: Every day | ORAL | 3 refills | Status: DC

## 2021-06-09 MED ORDER — BENZONATATE 200 MG PO CAPS
200.0000 mg | ORAL_CAPSULE | Freq: Three times a day (TID) | ORAL | 0 refills | Status: DC | PRN
Start: 2021-06-09 — End: 2021-09-27

## 2021-06-09 MED ORDER — DOXYCYCLINE HYCLATE 100 MG PO TABS: 100.0000 mg | ORAL_TABLET | Freq: Two times a day (BID) | ORAL | 0 refills | Status: DC

## 2021-06-09 NOTE — Telephone Encounter (Signed)
LVM for patient letting her know the cough med had been sent in. Invited her to call with any questions or concerns

## 2021-06-09 NOTE — Patient Instructions (Signed)
Ms. Bangerter -  Doristine Devoid to see you, sorry this hasn't gotten better!  Take doxycycline as prescribed.  Can use albuterol, tessaon, and hycodan as needed as prescribed.  Take pantoprazole every day for 1-2 weeks to see if there is improvement.  Chest X ray ordered to [place] at [facility] - they take walk ins  Thank you  Denice Paradise

## 2021-06-09 NOTE — Progress Notes (Signed)
Established Patient Office Visit  Subjective:  Patient ID: Shannon Contreras, female    DOB: 1965-10-07  Age: 56 y.o. MRN: [id number]  CC:  Chief Complaint  Patient presents with   Follow-up   Cough    HPI Shannon Contreras presents for follow up   Cough is ongoing.  Had been seen via E-Visit with Apolonio Schneiders FNP, given z pack and tessalon Followed up with me via VV next day, we added prednisone taper.  Since then, has completed course of medications.   Ongoing cough worse at night Feels like something needs to come up Coughs when trying to talk.  Notes midline discomfort in chest and towards epigastric area of abdomen Occ temp as well.    Past Medical History:  Diagnosis Date   Hx of adenomatous polyp of colon 10/19/2016    Past Surgical History:  Procedure Laterality Date   BREAST SURGERY     EYE SURGERY      Family History  Problem Relation Age of Onset   Hypertension Mother    Cancer Father        lymphoma   Prostate cancer Maternal Grandfather    Esophageal cancer Neg Hx    Rectal cancer Neg Hx    Stomach cancer Neg Hx     Social History   Socioeconomic History   Marital status: Divorced    Spouse name: Not on file   Number of children: Not on file   Years of education: Not on file   Highest education level: Not on file  Occupational History   Not on file  Tobacco Use   Smoking status: Never   Smokeless tobacco: Never  Substance and Sexual Activity   Alcohol use: Yes    Comment: socially    Drug use: No   Sexual activity: Not on file  Other Topics Concern   Not on file  Social History Narrative   Not on file   Social Determinants of Health   Financial Resource Strain: Not on file  Food Insecurity: Not on file  Transportation Needs: Not on file  Physical Activity: Not on file  Stress: Not on file  Social Connections: Not on file  Intimate Partner Violence: Not on file    Outpatient Medications Prior to  Visit  Medication Sig Dispense Refill   Multiple Vitamin (MULTIVITAMIN WITH MINERALS) TABS tablet Take 1 tablet by mouth daily.     predniSONE (STERAPRED UNI-PAK 21 TAB) 10 MG (21) TBPK tablet Take per package instructions. Do not skip doses. Finish entire supply. 1 each 0   benzonatate (TESSALON) 100 MG capsule Take 1 capsule (100 mg total) by mouth 3 (three) times daily as needed for up to 10 days for cough. 30 capsule 0   Ascorbic Acid (VITAMIN C) 100 MG tablet Take 100 mg by mouth daily. (Patient not taking: Reported on 06/02/2021)     cholecalciferol (VITAMIN D) 1000 units tablet Take 1,000 Units by mouth daily. (Patient not taking: Reported on 06/02/2021)     No facility-administered medications prior to visit.    Allergies  Allergen Reactions   Caffeine Other (See Comments)    Pt states that if she even smells caffeine (like in coffee) her eyes water and she gets a severe headache    ROS Review of Systems  Constitutional: Negative.   HENT: Negative.    Eyes: Negative.   Respiratory:  Positive for cough and wheezing. Negative for apnea, choking, chest tightness, shortness of breath and  stridor.   Cardiovascular: Negative.   Gastrointestinal: Negative.   Genitourinary: Negative.   Musculoskeletal: Negative.   Skin: Negative.   Neurological: Negative.   Psychiatric/Behavioral: Negative.    All other systems reviewed and are negative.    Objective:    Physical Exam Vitals and nursing note reviewed.  Constitutional:      General: She is not in acute distress.    Appearance: Normal appearance. She is normal weight. She is not ill-appearing, toxic-appearing or diaphoretic.  Cardiovascular:     Rate and Rhythm: Normal rate and regular rhythm.     Heart sounds: Normal heart sounds. No murmur heard.   No friction rub. No gallop.  Pulmonary:     Effort: Pulmonary effort is normal. No respiratory distress.     Breath sounds: No stridor. Wheezing present. No rhonchi or rales.   Chest:     Chest wall: No tenderness.  Skin:    General: Skin is warm and dry.  Neurological:     General: No focal deficit present.     Mental Status: She is alert and oriented to person, place, and time. Mental status is at baseline.  Psychiatric:        Mood and Affect: Mood normal.        Behavior: Behavior normal.        Thought Content: Thought content normal.        Judgment: Judgment normal.    BP 124/66    Pulse 75    Temp 99.1 F (37.3 C) (Oral)    Resp (!) 24    Ht 5\' 7"  (1.702 m)    Wt 155 lb 3.2 oz (70.4 kg)    SpO2 95%    BMI 24.31 kg/m  Wt Readings from Last 3 Encounters:  06/09/21 155 lb 3.2 oz (70.4 kg)  09/07/20 160 lb 3.2 oz (72.7 kg)  05/25/19 164 lb 8 oz (74.6 kg)     Health Maintenance Due  Topic Date Due   Hepatitis C Screening  Never done   Zoster Vaccines- Shingrix (1 of 2) Never done   INFLUENZA VACCINE  Never done    There are no preventive care reminders to display for this patient.  Lab Results  Component Value Date   TSH 0.73 09/07/2020   Lab Results  Component Value Date   WBC 4.5 09/07/2020   HGB 12.0 09/07/2020   HCT 37.0 09/07/2020   MCV 85.0 09/07/2020   PLT 236.0 09/07/2020   Lab Results  Component Value Date   NA 141 09/07/2020   K 3.7 09/07/2020   CO2 28 09/07/2020   GLUCOSE 74 09/07/2020   BUN 13 09/07/2020   CREATININE 1.04 09/07/2020   BILITOT 0.4 09/07/2020   ALKPHOS 55 09/07/2020   AST 21 09/07/2020   ALT 22 09/07/2020   PROT 7.3 09/07/2020   ALBUMIN 4.0 09/07/2020   CALCIUM 9.5 09/07/2020   GFR 60.50 09/07/2020   Lab Results  Component Value Date   CHOL 196 09/07/2020   Lab Results  Component Value Date   HDL 79.40 09/07/2020   Lab Results  Component Value Date   LDLCALC 104 (H) 09/07/2020   Lab Results  Component Value Date   TRIG 64.0 09/07/2020   Lab Results  Component Value Date   CHOLHDL 2 09/07/2020   Lab Results  Component Value Date   HGBA1C 6.0 02/14/2018      Assessment &  Plan:   Problem List Items Addressed This Visit  None Visit Diagnoses     Bronchitis    -  Primary   Relevant Medications   doxycycline (VIBRA-TABS) 100 MG tablet   albuterol (VENTOLIN HFA) 108 (90 Base) MCG/ACT inhaler   benzonatate (TESSALON) 200 MG capsule   HYDROcodone bit-homatropine (HYCODAN) 5-1.5 MG/5ML syrup   Other Relevant Orders   DG Chest 2 View   Acute cough       Relevant Medications   pantoprazole (PROTONIX) 40 MG tablet       Meds ordered this encounter  Medications   doxycycline (VIBRA-TABS) 100 MG tablet    Sig: Take 1 tablet (100 mg total) by mouth 2 (two) times daily.    Dispense:  20 tablet    Refill:  0    Order Specific Question:   Supervising Provider    Answer:   Carlota Raspberry, JEFFREY R [[id number]]   albuterol (VENTOLIN HFA) 108 (90 Base) MCG/ACT inhaler    Sig: Inhale 2 puffs into the lungs every 6 (six) hours as needed for wheezing or shortness of breath.    Dispense:  8 g    Refill:  0    Order Specific Question:   Supervising Provider    Answer:   Carlota Raspberry, JEFFREY R [[id number]]   benzonatate (TESSALON) 200 MG capsule    Sig: Take 1 capsule (200 mg total) by mouth 3 (three) times daily as needed for cough.    Dispense:  45 capsule    Refill:  0    Order Specific Question:   Supervising Provider    Answer:   Carlota Raspberry, JEFFREY R [[id number]]   HYDROcodone bit-homatropine (HYCODAN) 5-1.5 MG/5ML syrup    Sig: Take 5 mLs by mouth every 8 (eight) hours as needed for cough.    Dispense:  120 mL    Refill:  0    Order Specific Question:   Supervising Provider    Answer:   Carlota Raspberry, JEFFREY R [[id number]]   pantoprazole (PROTONIX) 40 MG tablet    Sig: Take 1 tablet (40 mg total) by mouth daily.    Dispense:  30 tablet    Refill:  3    Order Specific Question:   Supervising Provider    Answer:   Carlota Raspberry, JEFFREY R [[id number]]    Follow-up: Return if symptoms worsen or fail to improve.   PLAN Doxycycline 100mg  po bid x 10 days Albuterol, tessalon, and hycodan for relief. Cxr  ordered. Will follow up as warranted Rest and hydrate Pantoprazole in case aspect of GERD Patient encouraged to call clinic with any questions, comments, or concerns.   Maximiano Coss, NP

## 2021-06-09 NOTE — Telephone Encounter (Signed)
It appears that Richard sent in Hycodan cough syrup for pt today (1/13).  It should be available for pick up

## 2021-06-12 NOTE — Telephone Encounter (Signed)
Noted  Thanks,  Rich

## 2021-06-16 ENCOUNTER — Telehealth: Payer: Self-pay

## 2021-06-16 NOTE — Telephone Encounter (Signed)
Could be. I would recommend she take it with something to eat and a large glass of water to help with side effects. She should try to take as prescribed. I do recommend she take a few days off from work to rest as I think that would greatly benefit her.  Thanks,  Denice Paradise

## 2021-06-16 NOTE — Telephone Encounter (Signed)
Spoke to patient and let her know what Richard advised. She stated that she would try and would call if she needed anything

## 2021-06-16 NOTE — Telephone Encounter (Signed)
Caller name:Starasia Allen-Campbell   On DPR? :Yes  Call back [phone]  Provider they see: Delfino Lovett  Reason for call:Pt is following back up with Richard update on Medication he prescribed and having symptoms from the medication and she wants to know if it is normal. Pt would not give no other information or go into detail regarding the phone call

## 2021-06-19 ENCOUNTER — Telehealth: Payer: Self-pay

## 2021-06-19 NOTE — Telephone Encounter (Signed)
Caller name:Zissel J Allen-Campbell   On DPR? :Yes  Call back [phone]  Provider they see: Birdie Riddle   Reason for call:Richard seen patient on 06/09/2021 pt was diagnosed respiratory infection still having chills coughing, running nose and headache and not ready to go back. She is wanting to excuse from January 13th to February 1st pt is going to be working from home but had to have letter from HR

## 2021-06-22 NOTE — Telephone Encounter (Signed)
Spoke to pt turns out she is looking more for FMLA style letter to have accommodations to work from home. Pt reports her immune system is suppressed and she would like an "indefinite" letter to work from home she states her HR recommended this as it would then prompt FMLA paperwork. Is this okay or would you like new visit to evaluate?  Shannon Contreras is pt PCP

## 2021-06-22 NOTE — Telephone Encounter (Signed)
Pt is calling back for follow up  

## 2021-06-22 NOTE — Telephone Encounter (Signed)
Ok to write for requested dates  Thanks,  Denice Paradise

## 2021-06-23 NOTE — Telephone Encounter (Signed)
If she's looking for longer term leave that for her acute illness, I would recommend she follow up with her PCP for this - based on what I'd treated her for, she would not warrant indefinite leave - rather, she should improve or we should elevate care to specialist or more acute setting if worsening.  Thanks,  Denice Paradise

## 2021-06-23 NOTE — Telephone Encounter (Signed)
Pt informed and she has made an appointment to be seen by Dr Birdie Riddle on 2/1 for the other letter /accommodations discussion

## 2021-06-24 ENCOUNTER — Encounter: Payer: Self-pay | Admitting: Registered Nurse

## 2021-06-27 ENCOUNTER — Telehealth (INDEPENDENT_AMBULATORY_CARE_PROVIDER_SITE_OTHER): Payer: BC Managed Care – PPO | Admitting: Family Medicine

## 2021-06-27 ENCOUNTER — Encounter: Payer: Self-pay | Admitting: Family Medicine

## 2021-06-27 DIAGNOSIS — R42 Dizziness and giddiness: Secondary | ICD-10-CM | POA: Diagnosis not present

## 2021-06-27 DIAGNOSIS — R5383 Other fatigue: Secondary | ICD-10-CM | POA: Diagnosis not present

## 2021-06-27 NOTE — Progress Notes (Signed)
Virtual Visit via Video   I connected with patient on 06/27/21 at 11:00 AM EST by a video enabled telemedicine application and verified that I am speaking with the correct person using two identifiers.  Location patient: Home Location provider: [facility], Office Persons participating in the virtual visit: Patient, Provider, [place] [place] C)  I discussed the limitations of evaluation and management by telemedicine and the availability of in person appointments. The patient expressed understanding and agreed to proceed.  Subjective:   HPI:   URI- pt was seen and tx'd for bronchitis on 06/02/21.  Had negative CXR 1/13.  Pt reports she was 'exhausted' at the end of the year and this contributed to her getting sick.  Reports she continues to feel fatigued, has some light headedness.  Has been working from home since the beginning of the year.  Pt tested negative for COVID 2 weeks ago.  Continues to have chills.  Has nasal congestion in the mornings and evenings.  Pt reports good water intake.  Decreased appetite.  Has some difficulty sleeping  ROS:   See pertinent positives and negatives per HPI.  Patient Active Problem List   Diagnosis Date Noted   Overweight (BMI 25.0-29.9) 05/25/2019   Vitamin D deficiency 02/12/2018   Hx of adenomatous polyp of colon 10/19/2016   Physical exam 08/17/2016    Social History   Tobacco Use   Smoking status: Never   Smokeless tobacco: Never  Substance Use Topics   Alcohol use: Yes    Comment: socially     Current Outpatient Medications:    albuterol (VENTOLIN HFA) 108 (90 Base) MCG/ACT inhaler, Inhale 2 puffs into the lungs every 6 (six) hours as needed for wheezing or shortness of breath., Disp: 8 g, Rfl: 0   benzonatate (TESSALON) 200 MG capsule, Take 1 capsule (200 mg total) by mouth 3 (three) times daily as needed for cough., Disp: 45 capsule, Rfl: 0   HYDROcodone bit-homatropine (HYCODAN) 5-1.5 MG/5ML syrup, Take 5 mLs by mouth every  8 (eight) hours as needed for cough., Disp: 120 mL, Rfl: 0   Multiple Vitamin (MULTIVITAMIN WITH MINERALS) TABS tablet, Take 1 tablet by mouth daily., Disp: , Rfl:    pantoprazole (PROTONIX) 40 MG tablet, Take 1 tablet (40 mg total) by mouth daily., Disp: 30 tablet, Rfl: 3   predniSONE (STERAPRED UNI-PAK 21 TAB) 10 MG (21) TBPK tablet, Take per package instructions. Do not skip doses. Finish entire supply., Disp: 1 each, Rfl: 0   Ascorbic Acid (VITAMIN C) 100 MG tablet, Take 100 mg by mouth daily. (Patient not taking: Reported on 06/02/2021), Disp: , Rfl:    cholecalciferol (VITAMIN D) 1000 units tablet, Take 1,000 Units by mouth daily. (Patient not taking: Reported on 06/02/2021), Disp: , Rfl:    doxycycline (VIBRA-TABS) 100 MG tablet, Take 1 tablet (100 mg total) by mouth 2 (two) times daily. (Patient not taking: Reported on 06/27/2021), Disp: 20 tablet, Rfl: 0  Allergies  Allergen Reactions   Caffeine Other (See Comments)    Pt states that if she even smells caffeine (like in coffee) her eyes water and she gets a severe headache    Objective:   There were no vitals taken for this visit. AAOx3, NAD NCAT No obvious CN deficits Pt is able to speak clearly, coherently without shortness of breath or increased work of breathing.  Thought process is linear.  Mood is anxious  Assessment and Plan:   Fatigue- new.  Pt reports that since having  bronchitis at the beginning of the month she just hasn't been able to recover.  She states she was 'exhausted' prior to getting sick and feels that this played a role in her illness.  She states she tested negative for COVID but PCR was not done.  She is very anxious about how she is feeling b/c 'it's not normal'.  When asked about her mood, she says that other than wanting to feel better, 'everything is fine'.  Discussed that this may have been COVID and she is just taking awhile to recover but that we will check labs to r/o any metabolic or underlying causes.   Once her labs are available for review, we can determine the next step.  Pt expressed understanding and is in agreement w/ plan.    Annye Asa, MD 06/27/2021

## 2021-06-28 ENCOUNTER — Other Ambulatory Visit: Payer: BC Managed Care – PPO

## 2021-06-28 ENCOUNTER — Other Ambulatory Visit (INDEPENDENT_AMBULATORY_CARE_PROVIDER_SITE_OTHER): Payer: BC Managed Care – PPO

## 2021-06-28 ENCOUNTER — Ambulatory Visit: Payer: BC Managed Care – PPO | Admitting: Family Medicine

## 2021-06-28 DIAGNOSIS — R5383 Other fatigue: Secondary | ICD-10-CM | POA: Diagnosis not present

## 2021-06-29 LAB — BASIC METABOLIC PANEL
BUN: 15 mg/dL (ref 6–23)
CO2: 29 mEq/L (ref 19–32)
Calcium: 9.7 mg/dL (ref 8.4–10.5)
Chloride: 105 mEq/L (ref 96–112)
Creatinine, Ser: 0.95 mg/dL (ref 0.40–1.20)
GFR: 67.06 mL/min (ref >60.00)
Glucose, Bld: 107 mg/dL — ABNORMAL HIGH (ref 70–99)
Potassium: 3.7 mEq/L (ref 3.5–5.1)
Sodium: 143 mEq/L (ref 135–145)

## 2021-06-29 LAB — CBC WITH DIFFERENTIAL/PLATELET
Basophils Absolute: 0 10*3/uL (ref 0.0–0.1)
Basophils Relative: 1 % (ref 0.0–3.0)
Eosinophils Absolute: 0.3 10*3/uL (ref 0.0–0.7)
Eosinophils Relative: 5.5 % — ABNORMAL HIGH (ref 0.0–5.0)
HCT: 35.6 % — ABNORMAL LOW (ref 36.0–46.0)
Hemoglobin: 11.6 g/dL — ABNORMAL LOW (ref 12.0–15.0)
Lymphocytes Relative: 30.2 % (ref 12.0–46.0)
Lymphs Abs: 1.6 10*3/uL (ref 0.7–4.0)
MCHC: 32.5 g/dL (ref 30.0–36.0)
MCV: 84.6 fl (ref 78.0–100.0)
Monocytes Absolute: 0.5 10*3/uL (ref 0.1–1.0)
Monocytes Relative: 9.2 % (ref 3.0–12.0)
Neutro Abs: 2.8 10*3/uL (ref 1.4–7.7)
Neutrophils Relative %: 54.1 % (ref 43.0–77.0)
Platelets: 241 10*3/uL (ref 150.0–400.0)
RBC: 4.2 Mil/uL (ref 3.87–5.11)
RDW: 13.2 % (ref 11.5–15.5)
WBC: 5.2 10*3/uL (ref 4.0–10.5)

## 2021-06-29 LAB — HEPATIC FUNCTION PANEL
ALT: 15 U/L (ref 0–35)
AST: 19 U/L (ref 0–37)
Albumin: 4.3 g/dL (ref 3.5–5.2)
Alkaline Phosphatase: 51 U/L (ref 39–117)
Bilirubin, Direct: 0.1 mg/dL (ref 0.0–0.3)
Total Bilirubin: 0.4 mg/dL (ref 0.2–1.2)
Total Protein: 7.1 g/dL (ref 6.0–8.3)

## 2021-06-29 LAB — TSH: TSH: 1.8 u[IU]/mL (ref 0.35–5.50)

## 2021-06-29 LAB — SEDIMENTATION RATE: Sed Rate: 17 mm/hr (ref 0–30)

## 2021-06-30 ENCOUNTER — Telehealth: Payer: Self-pay

## 2021-06-30 LAB — ANA: Anti Nuclear Antibody (ANA): NEGATIVE

## 2021-06-30 NOTE — Telephone Encounter (Signed)
-----   Message from Midge Minium, MD sent at 06/29/2021  4:11 PM EST ----- Labs look great!  Your hemoglobin is just slightly low.  Make sure you are taking a daily multivitamin w/ iron.  Your eosinophil count is mildly elevated.  We usually see this when people have untreated seasonal allergies.  Make sure you are taking a daily Claritin or Zyrtec to improve symptoms.  No reason based on these labs to think you are immune compromised.  Ok to return to work.  But if concerned about possible exposures, it is always smart to wear a mask

## 2021-06-30 NOTE — Telephone Encounter (Signed)
Patient is aware of labs. She understood

## 2021-07-01 ENCOUNTER — Other Ambulatory Visit: Payer: Self-pay | Admitting: Registered Nurse

## 2021-07-01 DIAGNOSIS — J40 Bronchitis, not specified as acute or chronic: Secondary | ICD-10-CM

## 2021-09-12 ENCOUNTER — Encounter: Payer: BC Managed Care – PPO | Admitting: Family Medicine

## 2021-09-27 ENCOUNTER — Encounter: Payer: Self-pay | Admitting: Family Medicine

## 2021-09-27 ENCOUNTER — Ambulatory Visit (INDEPENDENT_AMBULATORY_CARE_PROVIDER_SITE_OTHER): Payer: BC Managed Care – PPO | Admitting: Family Medicine

## 2021-09-27 VITALS — BP 126/70 | HR 74 | Temp 98.0°F | Resp 16 | Ht 67.0 in | Wt 140.4 lb

## 2021-09-27 DIAGNOSIS — E7800 Pure hypercholesterolemia, unspecified: Secondary | ICD-10-CM | POA: Diagnosis not present

## 2021-09-27 DIAGNOSIS — Z0000 Encounter for general adult medical examination without abnormal findings: Secondary | ICD-10-CM

## 2021-09-27 DIAGNOSIS — E559 Vitamin D deficiency, unspecified: Secondary | ICD-10-CM

## 2021-09-27 LAB — CBC WITH DIFFERENTIAL/PLATELET
Basophils Absolute: 0 10*3/uL (ref 0.0–0.1)
Basophils Relative: 0.9 % (ref 0.0–3.0)
Eosinophils Absolute: 0.3 10*3/uL (ref 0.0–0.7)
Eosinophils Relative: 6.2 % — ABNORMAL HIGH (ref 0.0–5.0)
HCT: 38.2 % (ref 36.0–46.0)
Hemoglobin: 12.4 g/dL (ref 12.0–15.0)
Lymphocytes Relative: 34.7 % (ref 12.0–46.0)
Lymphs Abs: 1.5 10*3/uL (ref 0.7–4.0)
MCHC: 32.5 g/dL (ref 30.0–36.0)
MCV: 85.5 fl (ref 78.0–100.0)
Monocytes Absolute: 0.3 10*3/uL (ref 0.1–1.0)
Monocytes Relative: 8.1 % (ref 3.0–12.0)
Neutro Abs: 2.2 10*3/uL (ref 1.4–7.7)
Neutrophils Relative %: 50.1 % (ref 43.0–77.0)
Platelets: 222 10*3/uL (ref 150.0–400.0)
RBC: 4.47 Mil/uL (ref 3.87–5.11)
RDW: 13.1 % (ref 11.5–15.5)
WBC: 4.3 10*3/uL (ref 4.0–10.5)

## 2021-09-27 LAB — BASIC METABOLIC PANEL
BUN: 14 mg/dL (ref 6–23)
CO2: 29 mEq/L (ref 19–32)
Calcium: 9.5 mg/dL (ref 8.4–10.5)
Chloride: 105 mEq/L (ref 96–112)
Creatinine, Ser: 0.87 mg/dL (ref 0.40–1.20)
GFR: 74.4 mL/min (ref >60.00)
Glucose, Bld: 98 mg/dL (ref 70–99)
Potassium: 4.1 mEq/L (ref 3.5–5.1)
Sodium: 141 mEq/L (ref 135–145)

## 2021-09-27 LAB — HEPATIC FUNCTION PANEL
ALT: 21 U/L (ref 0–35)
AST: 23 U/L (ref 0–37)
Albumin: 4.5 g/dL (ref 3.5–5.2)
Alkaline Phosphatase: 57 U/L (ref 39–117)
Bilirubin, Direct: 0.1 mg/dL (ref 0.0–0.3)
Total Bilirubin: 0.6 mg/dL (ref 0.2–1.2)
Total Protein: 7.2 g/dL (ref 6.0–8.3)

## 2021-09-27 LAB — LIPID PANEL
Cholesterol: 226 mg/dL — ABNORMAL HIGH (ref 0–200)
HDL: 84.4 mg/dL (ref >39.00)
LDL Cholesterol: 130 mg/dL — ABNORMAL HIGH (ref 0–99)
NonHDL: 141.13
Total CHOL/HDL Ratio: 3
Triglycerides: 55 mg/dL (ref 0.0–149.0)
VLDL: 11 mg/dL (ref 0.0–40.0)

## 2021-09-27 LAB — TSH: TSH: 0.78 u[IU]/mL (ref 0.35–5.50)

## 2021-09-27 LAB — VITAMIN D 25 HYDROXY (VIT D DEFICIENCY, FRACTURES): VITD: 68.46 ng/mL (ref 30.00–100.00)

## 2021-09-27 NOTE — Assessment & Plan Note (Signed)
Check labs and replete prn. 

## 2021-09-27 NOTE — Assessment & Plan Note (Signed)
Pt's PE WNL.  UTD on pap, mammo, colonoscopy, Tdap.  Applauded her efforts at healthy diet and regular exercise- pt is down 15 lbs since last visit.  Check labs.  Anticipatory guidance provided.  ?

## 2021-09-27 NOTE — Patient Instructions (Signed)
Follow up in 1 year or as needed ?We'll notify you of your lab results and make any changes if needed ?Keep up the good work on healthy diet and regular exercise- you look FABULOUS!!! ?Call with any questions or concerns ?Stay Safe!  Stay Healthy! ?Have a great summer!!! ?

## 2021-09-27 NOTE — Progress Notes (Signed)
? ?  Subjective:  ? ? Patient ID: Shannon Contreras, female    DOB: [birth year], 56 y.o.   MRN: [id number] ? ?HPI ?CPE- UTD on  pap, mammo, colonoscopy, Tdap. ? ?Health Maintenance  ?Topic Date Due  ? Zoster Vaccines- Shingrix (1 of 2) Never done  ? Hepatitis C Screening  09/28/2022 (Originally 03/02/1984)  ? INFLUENZA VACCINE  12/26/2021  ? PAP SMEAR-Modifier  05/24/2022  ? MAMMOGRAM  02/02/2023  ? COLONOSCOPY (Pts 45-55yr Insurance coverage will need to be confirmed)  10/13/2023  ? TETANUS/TDAP  08/18/2026  ? HIV Screening  Completed  ? HPV VACCINES  Aged Out  ?  ? ? ?Review of Systems ?Patient reports no vision/ hearing changes, adenopathy,fever, persistant/recurrent hoarseness , swallowing issues, chest pain, palpitations, edema, persistant/recurrent cough, hemoptysis, dyspnea (rest/exertional/paroxysmal nocturnal), gastrointestinal bleeding (melena, rectal bleeding), abdominal pain, significant heartburn, bowel changes, GU symptoms (dysuria, hematuria, incontinence), Gyn symptoms (abnormal  bleeding, pain),  syncope, focal weakness, memory loss, numbness & tingling, skin/hair/nail changes, abnormal bruising or bleeding, anxiety, or depression.  ? ?+ 15 lb weight loss ?   ?Objective:  ? Physical Exam ?General Appearance:    Alert, cooperative, no distress, appears stated age  ?Head:    Normocephalic, without obvious abnormality, atraumatic  ?Eyes:    PERRL, conjunctiva/corneas clear, EOM's intact both eyes  ?Ears:    Normal TM's and external ear canals, both ears  ?Nose:   Nares normal, septum midline, mucosa normal, no drainage  ?  or sinus tenderness  ?Throat:   Lips, mucosa, and tongue normal; teeth and gums normal  ?Neck:   Supple, symmetrical, trachea midline, no adenopathy;  ?  Thyroid: no enlargement/tenderness/nodules  ?Back:     Symmetric, no curvature, ROM normal, no CVA tenderness  ?Lungs:     Clear to auscultation bilaterally, respirations unlabored  ?Chest Wall:    No tenderness or deformity   ? Heart:    Regular rate and rhythm, S1 and S2 normal, no murmur, rub ?  or gallop  ?Breast Exam:    Deferred to GYN  ?Abdomen:     Soft, non-tender, bowel sounds active all four quadrants,  ?  no masses, no organomegaly  ?Genitalia:    Deferred to GYN  ?Rectal:    ?Extremities:   Extremities normal, atraumatic, no cyanosis or edema  ?Pulses:   2+ and symmetric all extremities  ?Skin:   Skin color, texture, turgor normal, no rashes or lesions  ?Lymph nodes:   Cervical, supraclavicular, and axillary nodes normal  ?Neurologic:   CNII-XII intact, normal strength, sensation and reflexes  ?  throughout  ?  ? ? ? ?   ?Assessment & Plan:  ? ? ?

## 2021-09-28 ENCOUNTER — Telehealth: Payer: Self-pay

## 2021-09-28 NOTE — Telephone Encounter (Signed)
Left pt a Vm to call  the office in regards to lab results . ?

## 2021-09-28 NOTE — Telephone Encounter (Signed)
-----   Message from Midge Minium, MD sent at 09/28/2021  7:47 AM EDT ----- ?Total cholesterol and LDL (bad cholesterol) are both higher than last check but thankfully the ratio of good to bad is excellent.  No changes at this time. ? ?Remainder of labs look great! ?

## 2021-10-06 IMAGING — MG DIGITAL SCREENING BILAT W/ TOMO W/ CAD
8 series · 9 of 24 positions shown · non-contrast
Comparison: Previous exam(s).

CLINICAL DATA: Screening.

EXAM:
DIGITAL SCREENING BILATERAL MAMMOGRAM WITH TOMO AND CAD

[R CC synth-2D]
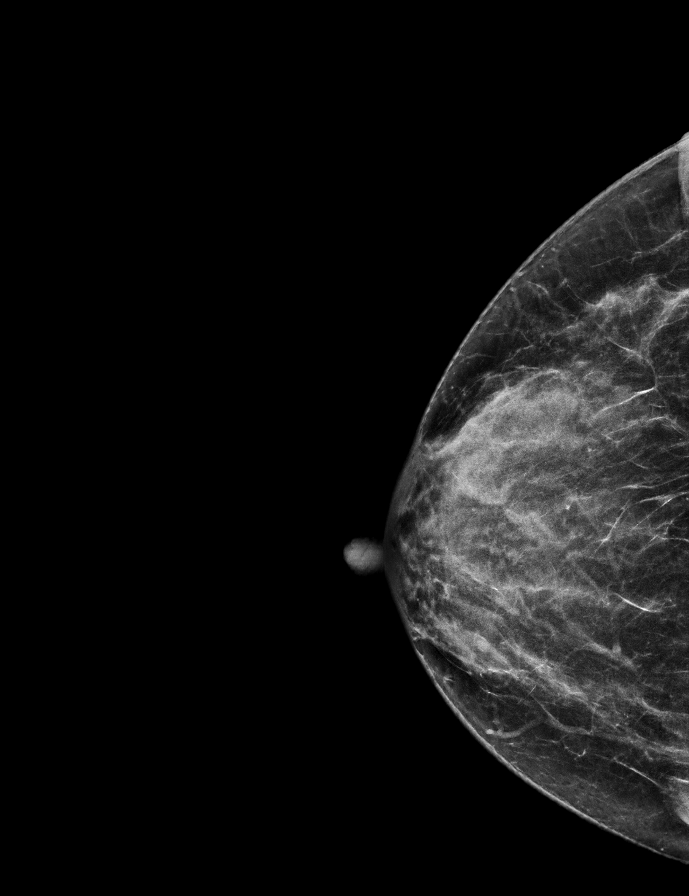

[L CC synth-2D]
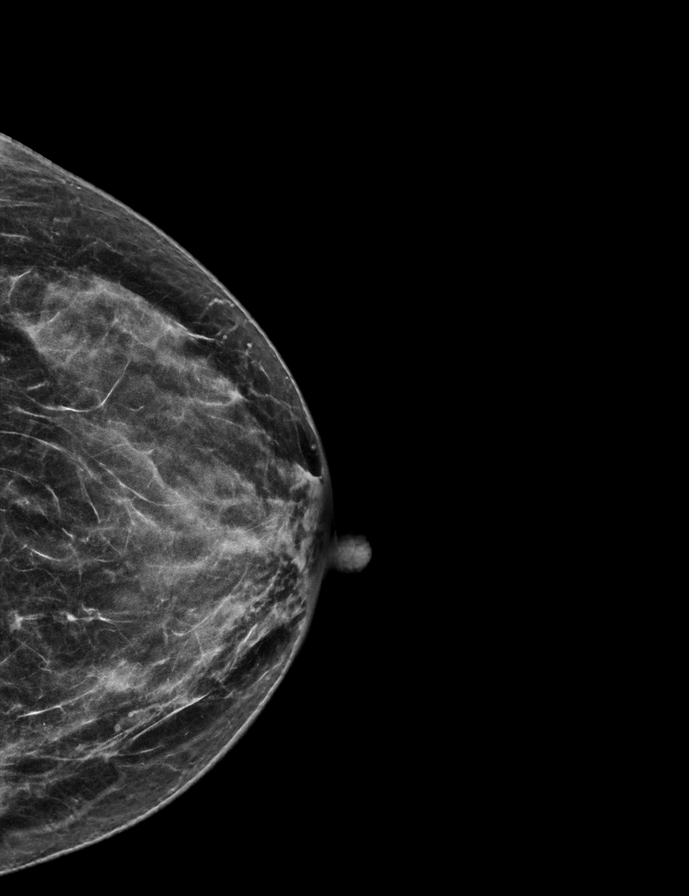

[L MLO synth-2D]
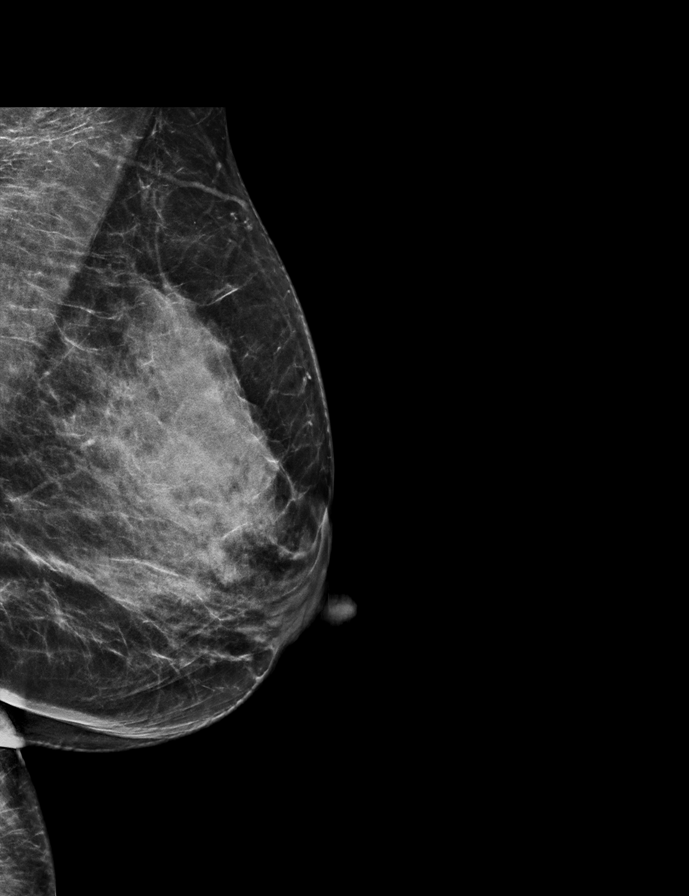

[R MLO synth-2D]
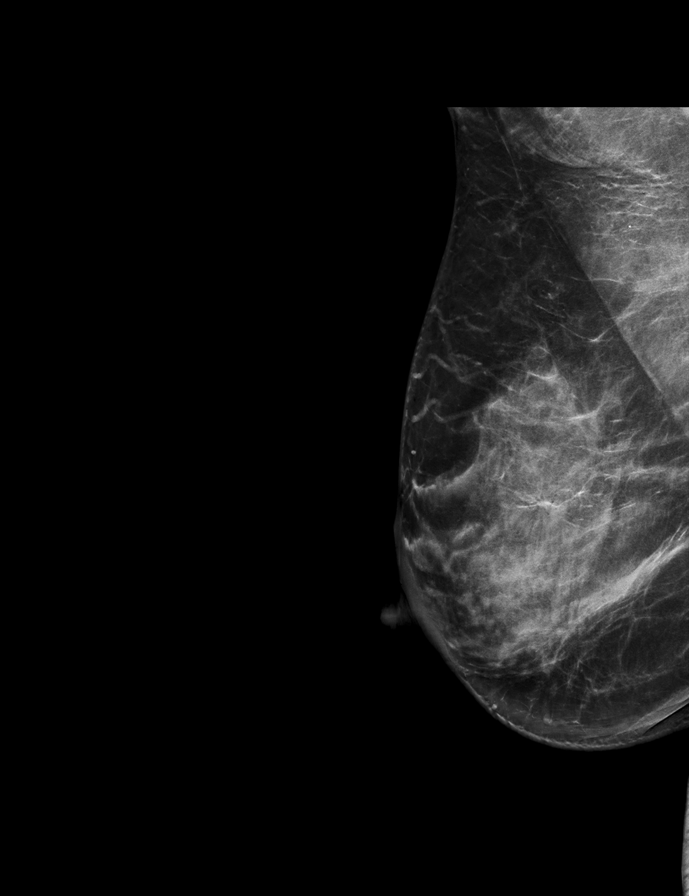

[L CC tomo · 2 of 55 frames shown]
[frame 18/55]
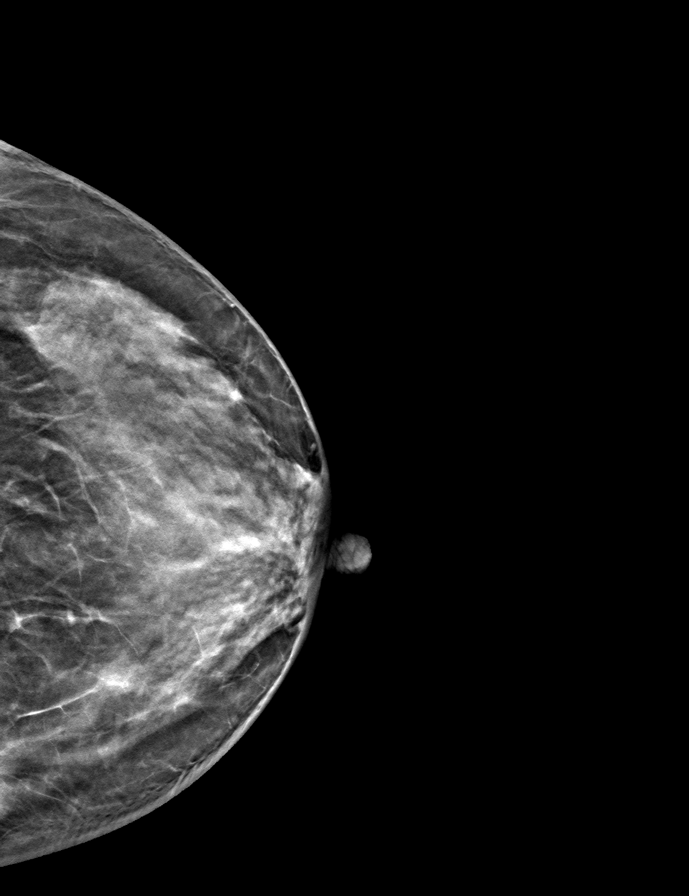
[frame 28/55]
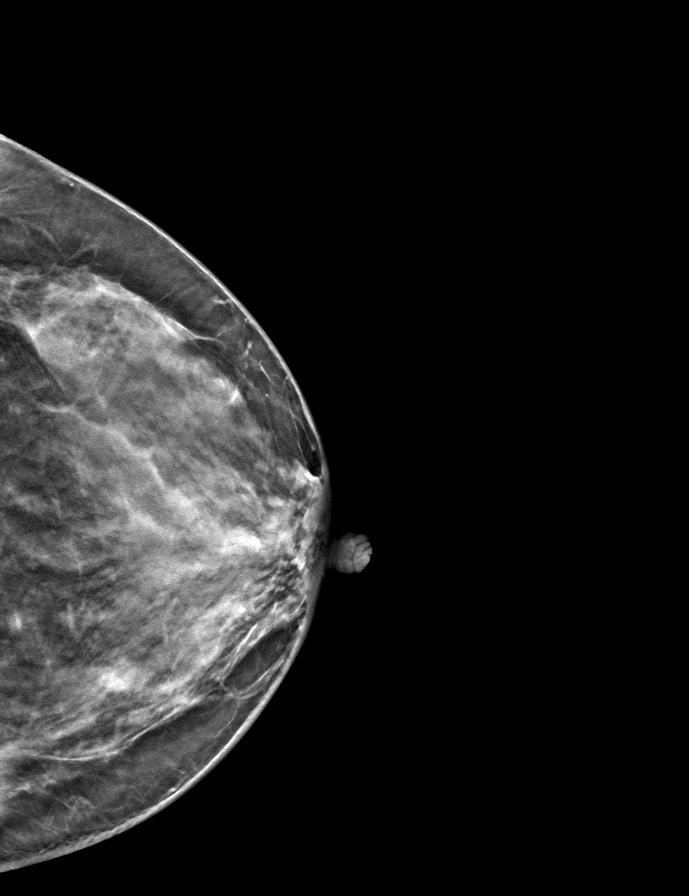

[L MLO tomo · tomo slice 31/62.0]
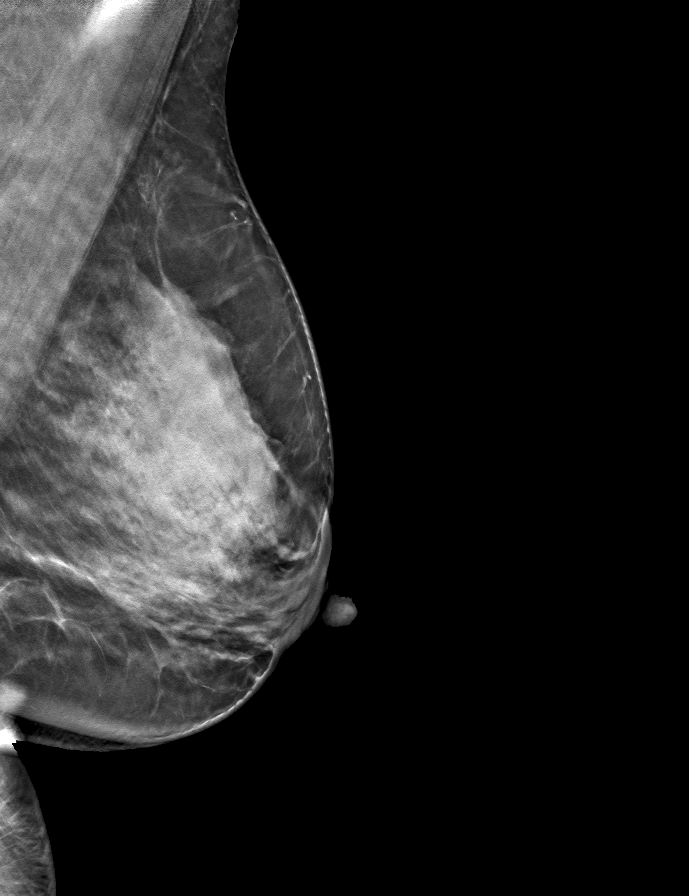

[R MLO tomo · tomo slice 35/69.0]
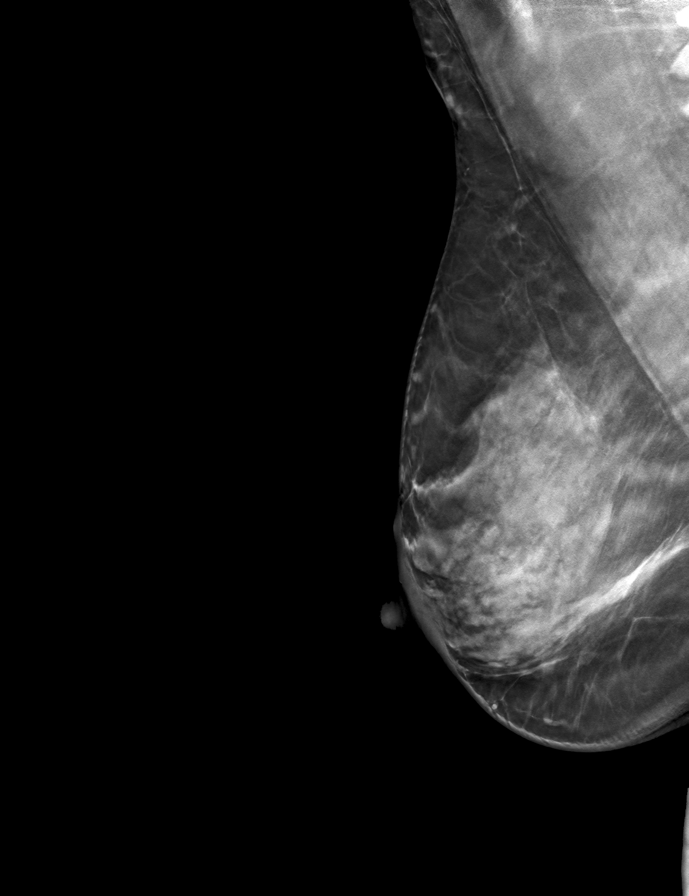

[R CC tomo · tomo slice 28/55.0]
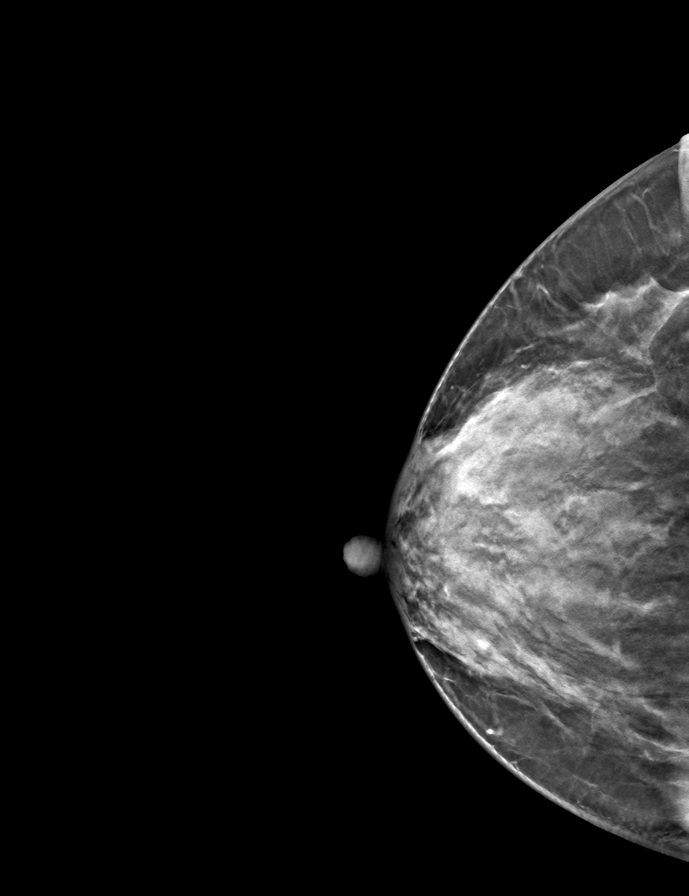

[9 of 24 positions shown; findings below may reference images not displayed]

ACR Breast Density Category d: The breast tissue is extremely dense,
which lowers the sensitivity of mammography.
FINDINGS: In the left breast, a possible asymmetry warrants further
evaluation. In the right breast, no findings suspicious for
malignancy. Images were processed with CAD.
IMPRESSION: Further evaluation is suggested for possible asymmetry in the left
breast.

RECOMMENDATION:
Diagnostic mammogram and possibly ultrasound of the left breast.
(Code:W0-2-002)

The patient will be contacted regarding the findings, and additional
imaging will be scheduled.

BI-RADS CATEGORY  0: Incomplete. Need additional imaging evaluation
and/or prior mammograms for comparison.

## 2021-11-10 ENCOUNTER — Telehealth: Payer: Self-pay | Admitting: Family Medicine

## 2021-11-10 NOTE — Telephone Encounter (Signed)
Spoke to the pt and she ask if I could fax the labs to [facility] @ [phone]

## 2021-11-10 NOTE — Telephone Encounter (Signed)
PT called to request her most recent lab results, after visit notes, and over all notes on labs. Pt want to if we can send it to her email [email or web address].

## 2022-02-06 DIAGNOSIS — Z1231 Encounter for screening mammogram for malignant neoplasm of breast: Secondary | ICD-10-CM | POA: Diagnosis not present

## 2022-02-06 LAB — HM MAMMOGRAPHY

## 2022-04-03 ENCOUNTER — Encounter: Payer: Self-pay | Admitting: Family Medicine

## 2022-12-21 ENCOUNTER — Ambulatory Visit (INDEPENDENT_AMBULATORY_CARE_PROVIDER_SITE_OTHER): Payer: No Typology Code available for payment source | Admitting: Family Medicine

## 2022-12-21 ENCOUNTER — Encounter: Payer: Self-pay | Admitting: Family Medicine

## 2022-12-21 VITALS — BP 108/64 | HR 78 | Temp 97.9°F | Ht 65.35 in | Wt 155.0 lb

## 2022-12-21 DIAGNOSIS — E559 Vitamin D deficiency, unspecified: Secondary | ICD-10-CM

## 2022-12-21 DIAGNOSIS — E7800 Pure hypercholesterolemia, unspecified: Secondary | ICD-10-CM

## 2022-12-21 DIAGNOSIS — Z0000 Encounter for general adult medical examination without abnormal findings: Secondary | ICD-10-CM | POA: Diagnosis not present

## 2022-12-21 LAB — CBC WITH DIFFERENTIAL/PLATELET
Absolute Monocytes: 390 cells/uL (ref 200–950)
Basophils Absolute: 30 cells/uL (ref 0–200)
Basophils Relative: 0.6 %
Eosinophils Absolute: 150 cells/uL (ref 15–500)
Eosinophils Relative: 3 %
HCT: 36.9 % (ref 35.0–45.0)
Hemoglobin: 12 g/dL (ref 11.7–15.5)
Lymphs Abs: 1950 cells/uL (ref 850–3900)
MCH: 27.4 pg (ref 27.0–33.0)
MCV: 84.2 fL (ref 80.0–100.0)
MPV: 11.1 fL (ref 7.5–12.5)
Monocytes Relative: 7.8 %
Neutro Abs: 2480 cells/uL (ref 1500–7800)
Neutrophils Relative %: 49.6 %
Platelets: 243 10*3/uL (ref 140–400)
RBC: 4.38 10*6/uL (ref 3.80–5.10)
RDW: 12.2 % (ref 11.0–15.0)
Total Lymphocyte: 39 %
WBC: 5 10*3/uL (ref 3.8–10.8)

## 2022-12-21 NOTE — Assessment & Plan Note (Signed)
Check labs and replete prn. 

## 2022-12-21 NOTE — Patient Instructions (Signed)
Follow up in 1 year or as needed We'll notify you of your lab results and make any changes if needed Keep up the good work on healthy diet and regular exercise- you can do it!! Call with any questions or concerns Stay Safe!  Stay Healthy! ENJOY YOUR TRIP HOME!!!

## 2022-12-21 NOTE — Progress Notes (Signed)
   Subjective:    Patient ID: Shannon Contreras, female    DOB: [birth year], 57 y.o.   MRN: [id number]  HPI CPE-  UTD on pap, colonoscopy, mammo, Tdap  Patient Care Team    Relationship Specialty Notifications Start End  Sheliah Hatch, MD PCP - General Family Medicine  08/17/16     Health Maintenance  Topic Date Due   Hepatitis C Screening  Never done   Zoster Vaccines- Shingrix (1 of 2) Never done   COVID-19 Vaccine (1 - 2023-24 season) Never done   PAP SMEAR-Modifier  05/24/2022   INFLUENZA VACCINE  12/27/2022   Colonoscopy  10/13/2023   MAMMOGRAM  02/07/2024   DTaP/Tdap/Td (2 - Td or Tdap) 08/18/2026   HIV Screening  Completed   HPV VACCINES  Aged Out      Review of Systems Patient reports no vision/ hearing changes, adenopathy,fever, weight change,  persistant/recurrent hoarseness , swallowing issues, chest pain, palpitations, edema, persistant/recurrent cough, hemoptysis, dyspnea (rest/exertional/paroxysmal nocturnal), gastrointestinal bleeding (melena, rectal bleeding), abdominal pain, significant heartburn, bowel changes, GU symptoms (dysuria, hematuria, incontinence), Gyn symptoms (abnormal  bleeding, pain),  syncope, focal weakness, memory loss, numbness & tingling, skin/hair/nail changes, abnormal bruising or bleeding, anxiety, or depression.     Objective:   Physical Exam General Appearance:    Alert, cooperative, no distress, appears stated age  Head:    Normocephalic, without obvious abnormality, atraumatic  Eyes:    PERRL, conjunctiva/corneas clear, EOM's intact both eyes  Ears:    Normal TM's and external ear canals, both ears  Nose:   Nares normal, septum midline, mucosa normal, no drainage    or sinus tenderness  Throat:   Lips, mucosa, and tongue normal; teeth and gums normal  Neck:   Supple, symmetrical, trachea midline, no adenopathy;    Thyroid: no enlargement/tenderness/nodules  Back:     Symmetric, no curvature, ROM normal, no CVA  tenderness  Lungs:     Clear to auscultation bilaterally, respirations unlabored  Chest Wall:    No tenderness or deformity   Heart:    Regular rate and rhythm, S1 and S2 normal, no murmur, rub   or gallop  Breast Exam:    Deferred to mammo  Abdomen:     Soft, non-tender, bowel sounds active all four quadrants,    no masses, no organomegaly  Genitalia:    Deferred to GYN  Rectal:    Extremities:   Extremities normal, atraumatic, no cyanosis or edema  Pulses:   2+ and symmetric all extremities  Skin:   Skin color, texture, turgor normal, no rashes or lesions  Lymph nodes:   Cervical, supraclavicular, and axillary nodes normal  Neurologic:   CNII-XII intact, normal strength, sensation and reflexes    throughout          Assessment & Plan:

## 2022-12-21 NOTE — Assessment & Plan Note (Signed)
Pt's PE WNL.  UTD on pap, mammo, colonoscopy, Tdap.  Check labs.  Anticipatory guidance provided.  

## 2022-12-22 LAB — CBC WITH DIFFERENTIAL/PLATELET: MCHC: 32.5 g/dL (ref 32.0–36.0)

## 2022-12-24 ENCOUNTER — Telehealth: Payer: Self-pay

## 2022-12-24 NOTE — Telephone Encounter (Signed)
-----   Message from Neena Rhymes sent at 12/24/2022  7:28 AM EDT ----- Labs are stable and look good!  No changes at this time

## 2022-12-24 NOTE — Telephone Encounter (Signed)
I have informed pt of lab results

## 2023-02-13 LAB — HM MAMMOGRAPHY

## 2023-02-14 ENCOUNTER — Encounter: Payer: Self-pay | Admitting: Family Medicine

## 2024-01-13 ENCOUNTER — Telehealth: Payer: Self-pay | Admitting: Family Medicine

## 2024-01-13 NOTE — Telephone Encounter (Unsigned)
 Copied from CRM #[id number]. Topic: Clinical - Request for Lab/Test Order >> Jan 13, 2024 11:07 AM Gennette ORN wrote: Reason for CRM: Patient is requesting labs for visit 02/20/24.

## 2024-01-13 NOTE — Telephone Encounter (Signed)
 Patient is requesting to get labs done for physical prior to appt date on 02/20/2024

## 2024-02-13 ENCOUNTER — Other Ambulatory Visit: Payer: Self-pay

## 2024-02-13 DIAGNOSIS — E559 Vitamin D deficiency, unspecified: Secondary | ICD-10-CM

## 2024-02-13 DIAGNOSIS — Z0000 Encounter for general adult medical examination without abnormal findings: Secondary | ICD-10-CM

## 2024-02-13 NOTE — Telephone Encounter (Signed)
 Pt can schedule a lab visit for the following....  Vit D 25 hydroxy- dx Vit D deficiency CBC w/ diff- dx overweight and physical exam (for all remaining labs) BMP LFTs TSH Lipid panel

## 2024-02-13 NOTE — Telephone Encounter (Signed)
 Labs have been ordered. Called pt unable to reach and schedule lab only visit.

## 2024-02-14 NOTE — Telephone Encounter (Signed)
 Called patient and was unable to leave vm. Patient needs to schedule lab visit prior to appt or get labs drawn at appt 02/20/24

## 2024-02-17 NOTE — Telephone Encounter (Signed)
 Called patient and unable to leave vm. Patient has appt with Dr. Mahlon 02/20/24. Labs to be drawn at that visit.

## 2024-02-18 ENCOUNTER — Encounter: Payer: Self-pay | Admitting: Internal Medicine

## 2024-02-20 ENCOUNTER — Encounter: Payer: Self-pay | Admitting: Family Medicine

## 2024-02-20 ENCOUNTER — Other Ambulatory Visit (HOSPITAL_COMMUNITY)
Admission: RE | Admit: 2024-02-20 | Discharge: 2024-02-20 | Disposition: A | Discharge status: Home / Self Care | Source: Ambulatory Visit | Attending: Family Medicine | Admitting: Family Medicine

## 2024-02-20 ENCOUNTER — Ambulatory Visit (INDEPENDENT_AMBULATORY_CARE_PROVIDER_SITE_OTHER): Admitting: Family Medicine

## 2024-02-20 VITALS — BP 124/62 | HR 95 | Temp 97.8°F | Ht 66.0 in | Wt 161.1 lb

## 2024-02-20 DIAGNOSIS — Z0000 Encounter for general adult medical examination without abnormal findings: Secondary | ICD-10-CM | POA: Diagnosis not present

## 2024-02-20 DIAGNOSIS — E663 Overweight: Secondary | ICD-10-CM

## 2024-02-20 DIAGNOSIS — Z124 Encounter for screening for malignant neoplasm of cervix: Secondary | ICD-10-CM | POA: Diagnosis present

## 2024-02-20 DIAGNOSIS — E559 Vitamin D deficiency, unspecified: Secondary | ICD-10-CM | POA: Diagnosis not present

## 2024-02-20 DIAGNOSIS — Z1159 Encounter for screening for other viral diseases: Secondary | ICD-10-CM | POA: Diagnosis not present

## 2024-02-20 LAB — HM MAMMOGRAPHY

## 2024-02-20 NOTE — Patient Instructions (Signed)
 Follow up in 1 year or as needed We'll notify you of your lab results and make any changes if needed Call and schedule your colonoscopy at your convenience Keep up the good work on healthy diet and regular exercise- you look great! Call with any questions or concerns Stay Safe!  Stay Healthy! Happy Early Dorth[place]!!!!

## 2024-02-20 NOTE — Progress Notes (Signed)
   Subjective:    Patient ID: Shannon Contreras, female    DOB: Feb 24, 1966, 58 y.o.   MRN: [id number]  HPI CPE- UTD on mammo, due for pap and colonoscopy (pt to schedule).  Declines flu, shingles, PNA.   Patient Care Team    Relationship Specialty Notifications Start End  Mahlon Comer BRAVO, MD PCP - General Family Medicine  08/17/16     Health Maintenance  Topic Date Due   Hepatitis C Screening  Never done   Hepatitis B Vaccines 19-59 Average Risk (1 of 3 - 19+ 3-dose series) Never done   Pneumococcal Vaccine: 50+ Years (1 of 1 - PCV) Never done   Zoster Vaccines- Shingrix (1 of 2) Never done   Cervical Cancer Screening (HPV/Pap Cotest)  05/24/2022   Colonoscopy  10/13/2023   Influenza Vaccine  Never done   COVID-19 Vaccine (1 - 2024-25 season) Never done   Mammogram  02/12/2025   DTaP/Tdap/Td (2 - Td or Tdap) 08/18/2026   HIV Screening  Completed   HPV VACCINES  Aged Out   Meningococcal B Vaccine  Aged Out      Review of Systems Patient reports no vision/ hearing changes, adenopathy,fever, persistant/recurrent hoarseness , swallowing issues, chest pain, palpitations, edema, persistant/recurrent cough, hemoptysis, dyspnea (rest/exertional/paroxysmal nocturnal), gastrointestinal bleeding (melena, rectal bleeding), abdominal pain, significant heartburn, bowel changes, GU symptoms (dysuria, hematuria, incontinence), Gyn symptoms (abnormal  bleeding, pain),  syncope, focal weakness, memory loss, numbness & tingling, skin/hair/nail changes, abnormal bruising or bleeding, anxiety, or depression.   +6 lb weight gain    Objective:   Physical Exam  General Appearance:    Alert, cooperative, no distress, appears stated age  Head:    Normocephalic, without obvious abnormality, atraumatic  Eyes:    PERRL, conjunctiva/corneas clear, EOM's intact both eyes  Ears:    Normal TM's and external ear canals, both ears  Nose:   Nares normal, septum midline, mucosa normal, no drainage     or sinus tenderness  Throat:   Lips, mucosa, and tongue normal; teeth and gums normal  Neck:   Supple, symmetrical, trachea midline, no adenopathy;    Thyroid : no enlargement/tenderness/nodules  Back:     Symmetric, no curvature, ROM normal, no CVA tenderness  Lungs:     Clear to auscultation bilaterally, respirations unlabored  Chest Wall:    No tenderness or deformity   Heart:    Regular rate and rhythm, S1 and S2 normal, no murmur, rub   or gallop  Breast Exam:    Deferred to mammo  Abdomen:     Soft, non-tender, bowel sounds active all four quadrants,    no masses, no organomegaly  Genitalia:    External genitalia normal, cervix normal in appearance, no CMT, uterus in normal size and position, adnexa w/out mass or tenderness, mucosa pink and moist, no lesions or discharge present  Rectal:    Large external hemorrhoid skin tag  Extremities:   Extremities normal, atraumatic, no cyanosis or edema  Pulses:   2+ and symmetric all extremities  Skin:   Skin color, texture, turgor normal, no rashes or lesions  Lymph nodes:   Cervical, supraclavicular, and axillary nodes normal  Neurologic:   CNII-XII intact, normal strength, sensation and reflexes    throughout          Assessment & Plan:

## 2024-02-20 NOTE — Assessment & Plan Note (Signed)
 Pt's PE WNL.  Pap done today.  UTD on mammo.  Will schedule colonoscopy.  Declines vaccines today.  Check labs.  Anticipatory guidance provided.

## 2024-02-21 ENCOUNTER — Encounter: Payer: Self-pay | Admitting: Family Medicine

## 2024-02-24 LAB — CYTOLOGY - PAP
Comment: NEGATIVE
Diagnosis: NEGATIVE
High risk HPV: NEGATIVE

## 2024-02-25 ENCOUNTER — Ambulatory Visit: Payer: Self-pay | Admitting: Family Medicine

## 2024-02-25 NOTE — Progress Notes (Signed)
 Pt has reviewed results via MyChart.

## 2024-05-04 ENCOUNTER — Telehealth: Payer: Self-pay

## 2024-05-04 NOTE — Telephone Encounter (Signed)
 RN attempted to call patient to schedule colonoscopy-recall, NO answer.  [facility] number left on voicemail to call at earliest convenience to get that scheduled.

## 2024-05-07 ENCOUNTER — Ambulatory Visit

## 2024-05-07 VITALS — Ht 66.0 in | Wt 160.0 lb

## 2024-05-07 DIAGNOSIS — Z860100 Personal history of colon polyps, unspecified: Secondary | ICD-10-CM

## 2024-05-07 NOTE — Progress Notes (Signed)
 PCP MD at time of PV: Comer Greet, MD __________________________________________________________________________________________________________________________________________  No egg allergy known to patient  No soy allergy known to patient No issues known to pt with past sedation with any surgeries or procedures Patient denies ever being told they had issues or difficulty with intubation  No FH of Malignant Hyperthermia Pt is not on diet pills Pt is not on  home 02  Pt is not on blood thinners  No A fib or A flutter Have any cardiac testing pending--no  LOA: independent  No Chew or Snuff tobacco __________________________________________________________________________________________________________________________________________  Constipation: no Prep: spilt dose Miralax  __________________________________________________________________________________________________________________________________________  PV completed with patient. Prep instructions reviewed and provided during apt.

## 2024-05-12 ENCOUNTER — Encounter: Payer: Self-pay | Admitting: Internal Medicine

## 2024-05-17 NOTE — Progress Notes (Unsigned)
 [facility] History and Physical   Primary Care Physician:  Shannon Comer BRAVO, MD   Reason for Procedure:    Encounter Diagnosis  Name Primary?   Hx of colonic polyps Yes     Plan:    colonoscopy   The patient was provided an opportunity to ask questions and all were answered. The patient agreed with the plan.   HPI: Shannon Contreras is a 58 y.o. female here for a surveillance colonoscopy exam, s/p removal diminutive adenoma in 2018.   Past Medical History:  Diagnosis Date   Hx of adenomatous polyp of colon 10/19/2016    Past Surgical History:  Procedure Laterality Date   BREAST SURGERY     EYE SURGERY       Current Outpatient Medications  Medication Sig Dispense Refill   APPLE CIDER VINEGAR PO Take by mouth.     Cholecalciferol (VITAMIN D -3 PO) Take by mouth.     Multiple Vitamins-Minerals (ONE-A-DAY WOMENS) tablet Take 1 tablet by mouth daily.     No current facility-administered medications for this visit.    Allergies as of 05/18/2024 - Review Complete 05/07/2024  Allergen Reaction Noted   Caffeine Other (See Comments) 08/17/2016    Family History  Problem Relation Age of Onset   Hypertension Mother    Cancer Father        lymphoma   Prostate cancer Maternal Grandfather    Esophageal cancer Neg Hx    Rectal cancer Neg Hx    Stomach cancer Neg Hx     Social History   Socioeconomic History   Marital status: Divorced    Spouse name: Not on file   Number of children: Not on file   Years of education: Not on file   Highest education level: Not on file  Occupational History   Not on file  Tobacco Use   Smoking status: Never    Passive exposure: Never   Smokeless tobacco: Never  Substance and Sexual Activity   Alcohol use: Yes    Comment: socially    Drug use: No   Sexual activity: Not Currently  Other Topics Concern   Not on file  Social History Narrative   Not on file   Social Drivers of Health   Tobacco  Use: Low Risk (05/07/2024)   Patient History    Smoking Tobacco Use: Never    Smokeless Tobacco Use: Never    Passive Exposure: Never  Financial Resource Strain: Not on file  Food Insecurity: Not on file  Transportation Needs: Not on file  Physical Activity: Not on file  Stress: Not on file (04/03/2023)  Social Connections: Not on file  Intimate Partner Violence: Not on file  Depression (PHQ2-9): Low Risk (02/20/2024)   Depression (PHQ2-9)    PHQ-2 Score: 1  Alcohol Screen: Not on file  Housing: Not on file  Utilities: Not on file  Health Literacy: Not on file    Review of Systems: Positive for *** All other review of systems negative except as mentioned in the HPI.  Physical Exam: Vital signs There were no vitals taken for this visit.  General:   Alert,  Well-developed, well-nourished, pleasant and cooperative in NAD Lungs:  Clear throughout to auscultation.   Heart:  Regular rate and rhythm; no murmurs, clicks, rubs,  or gallops. Abdomen:  Soft, nontender and nondistended. Normal bowel sounds.   Neuro/Psych:  Alert and cooperative. Normal mood and affect. A and O x 3   @Lorella Gomez  CHARLENA Commander, MD,  [facility] [phone] (pager) 05/17/2024 6:00 PM@

## 2024-05-18 ENCOUNTER — Encounter: Payer: Self-pay | Admitting: Internal Medicine

## 2024-05-18 ENCOUNTER — Ambulatory Visit: Admitting: Internal Medicine

## 2024-05-18 VITALS — BP 102/64 | HR 67 | Temp 97.9°F | Resp 14 | Ht 66.0 in | Wt 160.0 lb

## 2024-05-18 DIAGNOSIS — Z860101 Personal history of adenomatous and serrated colon polyps: Secondary | ICD-10-CM

## 2024-05-18 DIAGNOSIS — K644 Residual hemorrhoidal skin tags: Secondary | ICD-10-CM

## 2024-05-18 DIAGNOSIS — K648 Other hemorrhoids: Secondary | ICD-10-CM | POA: Diagnosis not present

## 2024-05-18 DIAGNOSIS — D124 Benign neoplasm of descending colon: Secondary | ICD-10-CM

## 2024-05-18 DIAGNOSIS — K635 Polyp of colon: Secondary | ICD-10-CM

## 2024-05-18 DIAGNOSIS — Z1211 Encounter for screening for malignant neoplasm of colon: Secondary | ICD-10-CM

## 2024-05-18 DIAGNOSIS — Z860100 Personal history of colon polyps, unspecified: Secondary | ICD-10-CM

## 2024-05-18 MED ORDER — SODIUM CHLORIDE 0.9 % IV SOLN: 500.0000 mL | INTRAVENOUS | Status: DC

## 2024-05-18 NOTE — Op Note (Signed)
 [facility] Patient Name: Shannon Contreras Procedure Date: 05/18/2024 9:44 AM MRN: [id number] Endoscopist: Lupita FORBES Commander , MD, [id number] Age: 58 Referring MD:  Date of Birth: 04-16-1966 Gender: Female Account #: [id number] Procedure:                Colonoscopy Indications:              High risk colon cancer surveillance: Personal                            history of colonic polyps, Last colonoscopy: 2018 Medicines:                Monitored Anesthesia Care Procedure:                Pre-Anesthesia Assessment:                           - Prior to the procedure, a History and Physical                            was performed, and patient medications and                            allergies were reviewed. The patient's tolerance of                            previous anesthesia was also reviewed. The risks                            and benefits of the procedure and the sedation                            options and risks were discussed with the patient.                            All questions were answered, and informed consent                            was obtained. Prior Anticoagulants: The patient has                            taken no anticoagulant or antiplatelet agents. ASA                            Grade Assessment: II - A patient with mild systemic                            disease. After reviewing the risks and benefits,                            the patient was deemed in satisfactory condition to                            undergo the procedure.  After obtaining informed consent, the colonoscope                            was passed under direct vision. Throughout the                            procedure, the patient's blood pressure, pulse, and                            oxygen saturations were monitored continuously. The                            CF [id number] #[id number] was introduced through the anus                             and advanced to the the cecum, identified by                            appendiceal orifice and ileocecal valve. The                            colonoscopy was performed without difficulty. The                            patient tolerated the procedure well. The quality                            of the bowel preparation was good. The ileocecal                            valve, appendiceal orifice, and rectum were                            photographed. The bowel preparation used was                            Miralax via split dose instruction. Scope In: 9:53:47 AM Scope Out: 10:08:38 AM Scope Withdrawal Time: 0 hours 12 minutes 17 seconds  Total Procedure Duration: 0 hours 14 minutes 51 seconds  Findings:                 Hemorrhoids were found on perianal exam.                           Two sessile polyps were found in the descending                            colon. The polyps were 1 to 2 mm in size. These                            polyps were removed with a cold biopsy forceps.                            Resection and retrieval were  complete. Verification                            of patient identification for the specimen was                            done. Estimated blood loss was minimal.                           External and internal hemorrhoids were found.                           The exam was otherwise without abnormality on                            direct and retroflexion views. Complications:            No immediate complications. Estimated Blood Loss:     Estimated blood loss was minimal. Impression:               - Two 1 to 2 mm polyps in the descending colon,                            removed with a cold biopsy forceps. Resected and                            retrieved.                           - External and internal hemorrhoids.                           - The examination was otherwise normal on direct                            and retroflexion views.                            - Personal history of colonic polyp diminutive                            adenoma 2018. Recommendation:           - Patient has a contact number available for                            emergencies. The signs and symptoms of potential                            delayed complications were discussed with the                            patient. Return to normal activities tomorrow.                            Written discharge instructions were provided to the  patient.                           - Resume previous diet.                           - Continue present medications.                           - Repeat colonoscopy is recommended. The                            colonoscopy date will be determined after pathology                            results from today's exam become available for                            review.                           - Would need surgical referral for hemorrhoid                            treatment - larger external component so suspect                            she would need surgery (do not think banding of                            internal alone would suffice) Lupita FORBES Commander, MD 05/18/2024 10:17:37 AM This report has been signed electronically.

## 2024-05-18 NOTE — Progress Notes (Signed)
 Report given to PACU, vss

## 2024-05-18 NOTE — Progress Notes (Signed)
 Called to room to assist during endoscopic procedure.  Patient ID and intended procedure confirmed with present staff. Received instructions for my participation in the procedure from the performing physician.

## 2024-05-18 NOTE — Progress Notes (Signed)
 Pt's states no medical or surgical changes since previsit or office visit.

## 2024-05-18 NOTE — Patient Instructions (Addendum)
 I found and removed 2 tiny polyps that look benign.  I will let you know pathology results and when to have another routine colonoscopy by mail and/or My Chart.  You have external>internal hemorrhoids. If you want help with these I can refer you to a colorectal surgeon, I do not think banding of internal hemorrhoids (what I can do) would help enough.  I appreciate the opportunity to care for you. Shannon CHARLENA Commander, MD, FACG  YOU HAD AN ENDOSCOPIC PROCEDURE TODAY AT THE [facility]:   Refer to the procedure report that was given to you for any specific questions about what was found during the examination.  If the procedure report does not answer your questions, please call your gastroenterologist to clarify.  If you requested that your care partner not be given the details of your procedure findings, then the procedure report has been included in a sealed envelope for you to review at your convenience later.  YOU SHOULD EXPECT: Some feelings of bloating in the abdomen. Passage of more gas than usual.  Walking can help get rid of the air that was put into your GI tract during the procedure and reduce the bloating. If you had a lower endoscopy (such as a colonoscopy or flexible sigmoidoscopy) you may notice spotting of blood in your stool or on the toilet paper. If you underwent a bowel prep for your procedure, you may not have a normal bowel movement for a few days.  Please Note:  You might notice some irritation and congestion in your nose or some drainage.  This is from the oxygen used during your procedure.  There is no need for concern and it should clear up in a day or so.  SYMPTOMS TO REPORT IMMEDIATELY:  Following lower endoscopy (colonoscopy or flexible sigmoidoscopy):  Excessive amounts of blood in the stool  Significant tenderness or worsening of abdominal pains  Swelling of the abdomen that is new, acute  Fever of 100F or higher  Resume previous diet Continue present  medications Would need surgical referral for hemorrhoid treatment Handouts on hemorrhoids and polyps given   For urgent or emergent issues, a gastroenterologist can be reached at any hour by calling [phone]. Do not use MyChart messaging for urgent concerns.    DIET:  We do recommend a small meal at first, but then you may proceed to your regular diet.  Drink plenty of fluids but you should avoid alcoholic beverages for 24 hours.  ACTIVITY:  You should plan to take it easy for the rest of today and you should NOT DRIVE or use heavy machinery until tomorrow (because of the sedation medicines used during the test).    FOLLOW UP: Our staff will call the number listed on your records the next business day following your procedure.  We will call around 7:15- 8:00 am to check on you and address any questions or concerns that you may have regarding the information given to you following your procedure. If we do not reach you, we will leave a message.     If any biopsies were taken you will be contacted by phone or by letter within the next 1-3 weeks.  Please call us  at [phone] if you have not heard about the biopsies in 3 weeks.    SIGNATURES/CONFIDENTIALITY: You and/or your care partner have signed paperwork which will be entered into your electronic medical record.  These signatures attest to the fact that that the information above on your  After Visit Summary has been reviewed and is understood.  Full responsibility of the confidentiality of this discharge information lies with you and/or your care-partner.

## 2024-05-19 ENCOUNTER — Telehealth: Payer: Self-pay | Admitting: *Deleted

## 2024-05-19 NOTE — Telephone Encounter (Signed)
 Post procedure follow up call placed, 3 attempts with no ring tone.  Unable to leave VM.

## 2024-05-25 LAB — SURGICAL PATHOLOGY

## 2024-05-28 ENCOUNTER — Ambulatory Visit: Payer: Self-pay | Admitting: Internal Medicine

## 2024-05-28 DIAGNOSIS — Z860101 Personal history of adenomatous and serrated colon polyps: Secondary | ICD-10-CM

## 2025-02-22 ENCOUNTER — Encounter: Admitting: Family Medicine
